# Patient Record
Sex: Female | Born: 1945 | Race: Black or African American | Hispanic: No | Smoking: Never smoker
Health system: Southern US, Community
[De-identification: ages and names within clinical notes are randomized; demographics above are authoritative.]

## PROBLEM LIST (undated history)

## (undated) DIAGNOSIS — N809 Endometriosis, unspecified: Secondary | ICD-10-CM

## (undated) DIAGNOSIS — D649 Anemia, unspecified: Secondary | ICD-10-CM

## (undated) DIAGNOSIS — M1990 Unspecified osteoarthritis, unspecified site: Secondary | ICD-10-CM

## (undated) HISTORY — PX: MYOMECTOMY: SHX85

## (undated) HISTORY — PX: PARTIAL HYSTERECTOMY: SHX80

## (undated) HISTORY — PX: KNEE ARTHROSCOPY: SHX127

## (undated) HISTORY — PX: FOOT SURGERY: SHX648

---

## 2000-02-13 ENCOUNTER — Other Ambulatory Visit: Admission: RE | Admit: 2000-02-13 | Discharge: 2000-02-13 | Payer: Self-pay | Admitting: Obstetrics and Gynecology

## 2000-03-25 ENCOUNTER — Encounter: Payer: Self-pay | Admitting: Obstetrics and Gynecology

## 2000-03-25 ENCOUNTER — Ambulatory Visit (HOSPITAL_COMMUNITY): Admission: RE | Admit: 2000-03-25 | Discharge: 2000-03-25 | Payer: Self-pay | Admitting: Obstetrics and Gynecology

## 2001-12-30 ENCOUNTER — Other Ambulatory Visit: Admission: RE | Admit: 2001-12-30 | Discharge: 2001-12-30 | Payer: Self-pay | Admitting: Obstetrics and Gynecology

## 2006-05-07 ENCOUNTER — Ambulatory Visit (HOSPITAL_BASED_OUTPATIENT_CLINIC_OR_DEPARTMENT_OTHER): Admission: RE | Admit: 2006-05-07 | Discharge: 2006-05-07 | Payer: Self-pay | Admitting: Orthopedic Surgery

## 2008-09-14 ENCOUNTER — Other Ambulatory Visit: Admission: RE | Admit: 2008-09-14 | Discharge: 2008-09-14 | Payer: Self-pay | Admitting: Obstetrics and Gynecology

## 2008-09-14 ENCOUNTER — Ambulatory Visit: Payer: Self-pay | Admitting: Obstetrics and Gynecology

## 2008-09-14 ENCOUNTER — Encounter: Payer: Self-pay | Admitting: Obstetrics and Gynecology

## 2008-10-18 ENCOUNTER — Ambulatory Visit: Payer: Self-pay | Admitting: Obstetrics and Gynecology

## 2011-04-05 NOTE — Op Note (Signed)
NAMECAMELIA, Sara Roberson              ACCOUNT NO.:  [id number]   MEDICAL RECORD NO.:  [id number]          PATIENT TYPE:  AMB   LOCATION:  DSC                          FACILITY:  [facility]   PHYSICIAN:  Loreta Ave, M.D. DATE OF BIRTH:  1946-08-01   DATE OF PROCEDURE:  05/07/2006  DATE OF DISCHARGE:                                 OPERATIVE REPORT   PREOPERATIVE DIAGNOSIS:  Left knee tricompartmental chondromalacia, with  torn lateral meniscus.   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   PROCEDURE:  Left knee exam under anesthesia, arthroscopy, debridement of  medial and lateral meniscus, removal of loose body, generalized  chondroplasty of all three compartments, with partial synovectomy.   SURGEON:  Loreta Ave, M.D.   ASSISTANT:  Genene Churn. Denton Meek.   ANESTHESIA:  General.   BLOOD LOSS:  Minimal.   TOURNIQUET:  Not employed.   SPECIMENS:  None.   CULTURES:  None.   COMPLICATIONS:  None.   DRESSINGS:  Soft, compressive.   PROCEDURE:  The patient was brought to the operating room, and after  adequate anesthesia had been obtained, left knee examined.  Full motion  lateral and patellofemoral tracking, not extensive tethering.  Stable  ligaments.  Tourniquet and leg holder applied.  Leg prepped and draped in  the usual sterile fashion.  Three portals were created, one superolateral,  and one in each medial and lateral parapatellar.  Inflow catheter induced.  Knee distended, arthroscope introduced, and the knee inspected.  Extensive  grade 3 changes of patellofemoral joint, with the entire lateral half grade  4 both on the patella and on the trochlea through full motion.  She tracks  laterally, but does not tether.  Chondroplasty throughout.  One large 1.5 cm  diameter osteochondral loose  body found in the suprapatellar pouch, able to  be retrieved and extracted with a large grasping forceps.  Cruciate ligament  was intact.  Medial compartment grade 2 changes, with frayed flap tears  middle and anterior third debrided.  Lateral compartment extensive tearing  of the anterior half lateral meniscus.  Numerous displaced irreparable  fragments.  Anterior half removed.  Taper into remaining medial meniscus  __________ in the posterior half.  Grade 2 and some grade 3 changes there.  Chondroplasty throughout.  Marked hypertrophic synovitis throughout the  knee, all debrided.  All  recesses examined to be sure all loose bodies were removed.  Instruments and  fluid removed.  Portals of the knee injected with Marcaine.  Portals closed  with 4-0 nylon.  Sterile compressive dressing applied.  Anesthesia reversed.  Brought to the recovery room.  Tolerated surgery well.  No complications.      Loreta Ave, M.D.  Electronically Signed     DFM/MEDQ  D:  05/07/2006  T:  05/07/2006  Job:  [id number]

## 2015-08-29 ENCOUNTER — Encounter (HOSPITAL_COMMUNITY): Payer: Self-pay | Admitting: Emergency Medicine

## 2015-08-29 ENCOUNTER — Emergency Department (HOSPITAL_COMMUNITY): Payer: Medicare Other

## 2015-08-29 ENCOUNTER — Emergency Department (HOSPITAL_COMMUNITY)
Admission: EM | Admit: 2015-08-29 | Discharge: 2015-08-29 | Disposition: A | Discharge status: Home / Self Care | Payer: Medicare Other | Attending: Emergency Medicine | Admitting: Emergency Medicine

## 2015-08-29 DIAGNOSIS — Z79899 Other long term (current) drug therapy: Secondary | ICD-10-CM | POA: Diagnosis not present

## 2015-08-29 DIAGNOSIS — I10 Essential (primary) hypertension: Secondary | ICD-10-CM | POA: Insufficient documentation

## 2015-08-29 DIAGNOSIS — R42 Dizziness and giddiness: Secondary | ICD-10-CM | POA: Insufficient documentation

## 2015-08-29 DIAGNOSIS — R112 Nausea with vomiting, unspecified: Secondary | ICD-10-CM | POA: Insufficient documentation

## 2015-08-29 LAB — CBC
HCT: 37.2 % (ref 36.0–46.0)
HEMOGLOBIN: 11.7 g/dL — AB (ref 12.0–15.0)
MCH: 29 pg (ref 26.0–34.0)
MCHC: 31.5 g/dL (ref 30.0–36.0)
MCV: 92.1 fL (ref 78.0–100.0)
PLATELETS: 198 10*3/uL (ref 150–400)
RBC: 4.04 MIL/uL (ref 3.87–5.11)
RDW: 13.8 % (ref 11.5–15.5)
WBC: 4.1 10*3/uL (ref 4.0–10.5)

## 2015-08-29 LAB — BASIC METABOLIC PANEL
ANION GAP: 4 — AB (ref 5–15)
BUN: 12 mg/dL (ref 6–20)
CHLORIDE: 112 mmol/L — AB (ref 101–111)
CO2: 24 mmol/L (ref 22–32)
CREATININE: 0.64 mg/dL (ref 0.44–1.00)
Calcium: 9.1 mg/dL (ref 8.9–10.3)
GFR calc non Af Amer: >60 mL/min (ref >60)
Glucose, Bld: 113 mg/dL — ABNORMAL HIGH (ref 65–99)
POTASSIUM: 4.2 mmol/L (ref 3.5–5.1)
SODIUM: 140 mmol/L (ref 135–145)

## 2015-08-29 LAB — URINALYSIS, ROUTINE W REFLEX MICROSCOPIC
Bilirubin Urine: NEGATIVE
Glucose, UA: NEGATIVE mg/dL
Hgb urine dipstick: NEGATIVE
KETONES UR: NEGATIVE mg/dL
LEUKOCYTES UA: NEGATIVE
NITRITE: NEGATIVE
PH: 8 (ref 5.0–8.0)
PROTEIN: NEGATIVE mg/dL
Specific Gravity, Urine: 1.016 (ref 1.005–1.030)
Urobilinogen, UA: 0.2 mg/dL (ref 0.0–1.0)

## 2015-08-29 LAB — CBG MONITORING, ED: Glucose-Capillary: 107 mg/dL — ABNORMAL HIGH (ref 65–99)

## 2015-08-29 MED ORDER — LORAZEPAM 2 MG/ML IJ SOLN
1.0000 mg | Freq: Once | INTRAMUSCULAR | Status: AC
  Administered 2015-08-29: 1 mg via INTRAVENOUS
  Filled 2015-08-29: qty 1

## 2015-08-29 MED ORDER — MECLIZINE HCL 50 MG PO TABS: 25.0000 mg | ORAL_TABLET | Freq: Three times a day (TID) | ORAL | Status: DC | PRN

## 2015-08-29 MED ORDER — MECLIZINE HCL 25 MG PO TABS
25.0000 mg | ORAL_TABLET | Freq: Once | ORAL | Status: AC
  Administered 2015-08-29: 25 mg via ORAL
  Filled 2015-08-29: qty 1

## 2015-08-29 NOTE — ED Notes (Signed)
PT st's dizziness has gotton some better after medication.  Pt remains alert and oriented x's 3

## 2015-08-29 NOTE — ED Provider Notes (Signed)
Mr Brain Wo Contrast  08/29/2015   CLINICAL DATA:  Ataxia. Awoke this morning with dizziness and nausea.  EXAM: MRI HEAD WITHOUT CONTRAST  TECHNIQUE: Multiplanar, multiecho pulse sequences of the brain and surrounding structures were obtained without intravenous contrast.  COMPARISON:  None.  FINDINGS: There is a mildly expanded partially empty sella. The cerebellar tonsils project at or minimally below the foramen magnum, within normal limits. There is no evidence of acute infarct, intracranial hemorrhage, mass, midline shift, or extra-axial fluid collection. Ventricles and sulci are normal for age. No significant white matter disease is seen for age.  Prior bilateral cataract extraction is noted. Paranasal sinuses and mastoid air cells are clear. Major intracranial vascular flow voids are preserved.  IMPRESSION: 1. No acute intracranial abnormality. 2. Partially empty sella, a nonspecific finding although can be seen in the setting of intracranial hypertension.   Electronically Signed   By: Sebastian Ache M.D.   On: 08/29/2015 19:08   MRI without acute stroke.  Dc home with symptomatic meds for vertigo  Linwood Dibbles, MD 08/29/15 1944

## 2015-08-29 NOTE — Discharge Instructions (Signed)
Dizziness °Dizziness is a common problem. It is a feeling of unsteadiness or light-headedness. You may feel like you are about to faint. Dizziness can lead to injury if you stumble or fall. Anyone can become dizzy, but dizziness is more common in older adults. This condition can be caused by a number of things, including medicines, dehydration, or illness. °HOME CARE INSTRUCTIONS °Taking these steps may help with your condition: °Eating and Drinking °· Drink enough fluid to keep your urine clear or pale yellow. This helps to keep you from becoming dehydrated. Try to drink more clear fluids, such as water. °· Do not drink alcohol. °· Limit your caffeine intake if directed by your health care provider. °· Limit your salt intake if directed by your health care provider. °Activity °· Avoid making quick movements. °· Rise slowly from chairs and steady yourself until you feel okay. °· In the morning, first sit up on the side of the bed. When you feel okay, stand slowly while you hold onto something until you know that your balance is fine. °· Move your legs often if you need to stand in one place for a long time. Tighten and relax your muscles in your legs while you are standing. °· Do not drive or operate heavy machinery if you feel dizzy. °· Avoid bending down if you feel dizzy. Place items in your home so that they are easy for you to reach without leaning over. °Lifestyle °· Do not use any tobacco products, including cigarettes, chewing tobacco, or electronic cigarettes. If you need help quitting, ask your health care provider. °· Try to reduce your stress level, such as with yoga or meditation. Talk with your health care provider if you need help. °General Instructions °· Watch your dizziness for any changes. °· Take medicines only as directed by your health care provider. Talk with your health care provider if you think that your dizziness is caused by a medicine that you are taking. °· Tell a friend or a family  member that you are feeling dizzy. If he or she notices any changes in your behavior, have this person call your health care provider. °· Keep all follow-up visits as directed by your health care provider. This is important. °SEEK MEDICAL CARE IF: °· Your dizziness does not go away. °· Your dizziness or light-headedness gets worse. °· You feel nauseous. °· You have reduced hearing. °· You have new symptoms. °· You are unsteady on your feet or you feel like the room is spinning. °SEEK IMMEDIATE MEDICAL CARE IF: °· You vomit or have diarrhea and are unable to eat or drink anything. °· You have problems talking, walking, swallowing, or using your arms, hands, or legs. °· You feel generally weak. °· You are not thinking clearly or you have trouble forming sentences. It may take a friend or family member to notice this. °· You have chest pain, abdominal pain, shortness of breath, or sweating. °· Your vision changes. °· You notice any bleeding. °· You have a headache. °· You have neck pain or a stiff neck. °· You have a fever. °  °This information is not intended to replace advice given to you by your health care provider. Make sure you discuss any questions you have with your health care provider. °  °Document Released: 04/30/2001 Document Revised: 03/21/2015 Document Reviewed: 10/31/2014 °Elsevier Interactive Patient Education ©2016 [employer] ° °Benign Positional Vertigo °Vertigo is the feeling that you or your surroundings are moving when they are not.   Benign positional vertigo is the most common form of vertigo. The cause of this condition is not serious (is benign). This condition is triggered by certain movements and positions (is positional). This condition can be dangerous if it occurs while you are doing something that could endanger you or others, such as driving.  °CAUSES °In many cases, the cause of this condition is not known. It may be caused by a disturbance in an area of the inner ear that helps your  brain to sense movement and balance. This disturbance can be caused by a viral infection (labyrinthitis), head injury, or repetitive motion. °RISK FACTORS °This condition is more likely to develop in: °· Women. °· People who are 50 years of age or older. °SYMPTOMS °Symptoms of this condition usually happen when you move your head or your eyes in different directions. Symptoms may start suddenly, and they usually last for less than a minute. Symptoms may include: °· Loss of balance and falling. °· Feeling like you are spinning or moving. °· Feeling like your surroundings are spinning or moving. °· Nausea and vomiting. °· Blurred vision. °· Dizziness. °· Involuntary eye movement (nystagmus). °Symptoms can be mild and cause only slight annoyance, or they can be severe and interfere with daily life. Episodes of benign positional vertigo may return (recur) over time, and they may be triggered by certain movements. Symptoms may improve over time. °DIAGNOSIS °This condition is usually diagnosed by medical history and a physical exam of the head, neck, and ears. You may be referred to a health care provider who specializes in ear, nose, and throat (ENT) problems (otolaryngologist) or a provider who specializes in disorders of the nervous system (neurologist). You may have additional testing, including: °· MRI. °· A CT scan. °· Eye movement tests. Your health care provider may ask you to change positions quickly while he or she watches you for symptoms of benign positional vertigo, such as nystagmus. Eye movement may be tested with an electronystagmogram (ENG), caloric stimulation, the Dix-Hallpike test, or the roll test. °· An electroencephalogram (EEG). This records electrical activity in your brain. °· Hearing tests. °TREATMENT °Usually, your health care provider will treat this by moving your head in specific positions to adjust your inner ear back to normal. Surgery may be needed in severe cases, but this is rare. In  some cases, benign positional vertigo may resolve on its own in 2-4 weeks. °HOME CARE INSTRUCTIONS °Safety °· Move slowly. Avoid sudden body or head movements. °· Avoid driving. °· Avoid operating heavy machinery. °· Avoid doing any tasks that would be dangerous to you or others if a vertigo episode would occur. °· If you have trouble walking or keeping your balance, try using a cane for stability. If you feel dizzy or unstable, sit down right away. °· Return to your normal activities as told by your health care provider. Ask your health care provider what activities are safe for you. °General Instructions °· Take over-the-counter and prescription medicines only as told by your health care provider. °· Avoid certain positions or movements as told by your health care provider. °· Drink enough fluid to keep your urine clear or pale yellow. °· Keep all follow-up visits as told by your health care provider. This is important. °SEEK MEDICAL CARE IF: °· You have a fever. °· Your condition gets worse or you develop new symptoms. °· Your family or friends notice any behavioral changes. °· Your nausea or vomiting gets worse. °· You have numbness or a "  pins and needles" sensation. °SEEK IMMEDIATE MEDICAL CARE IF: °· You have difficulty speaking or moving. °· You are always dizzy. °· You faint. °· You develop severe headaches. °· You have weakness in your legs or arms. °· You have changes in your hearing or vision. °· You develop a stiff neck. °· You develop sensitivity to light. °  °This information is not intended to replace advice given to you by your health care provider. Make sure you discuss any questions you have with your health care provider. °  °Document Released: 08/12/2006 Document Revised: 07/26/2015 Document Reviewed: 02/27/2015 °Elsevier Interactive Patient Education ©2016 [employer]vier Inc[employer] ° °

## 2015-08-29 NOTE — ED Provider Notes (Signed)
CSN: [id number]     Arrival date & time 08/29/15  1340 History   First MD Initiated Contact with Patient 08/29/15 1344     Chief Complaint  Patient presents with  . Dizziness  . Emesis     (Consider location/radiation/quality/duration/timing/severity/associated sxs/prior Treatment) Patient is a 69 y.o. female presenting with dizziness.  Dizziness Quality:  Head spinning and room spinning Severity:  Moderate Onset quality:  Unable to specify Duration:  10 hours Timing:  Constant Progression:  Unchanged Chronicity:  Recurrent Context comment:  Awoke from sleep Relieved by:  Nothing Worsened by:  Turning head Associated symptoms: nausea and vomiting   Associated symptoms: no blood in stool, no chest pain, no diarrhea and no headaches     History reviewed. No pertinent past medical history. Past Surgical History  Procedure Laterality Date  . Knee arthroscopy    . Partial hysterectomy    . Myomectomy    . Foot surgery     No family history on file. Social History  Substance Use Topics  . Smoking status: Never Smoker   . Smokeless tobacco: None  . Alcohol Use: No   OB History    No data available     Review of Systems  Cardiovascular: Negative for chest pain.  Gastrointestinal: Positive for nausea and vomiting. Negative for diarrhea and blood in stool.  Neurological: Positive for dizziness. Negative for headaches.  All other systems reviewed and are negative.     Allergies  Aspirin  Home Medications   Prior to Admission medications   Medication Sig Start Date End Date Taking? Authorizing Provider  Ascorbic Acid (VITAMIN C PO) Take 1 tablet by mouth daily.   Yes Historical Provider, MD  CALCIUM PO Take 1 tablet by mouth daily.   Yes Historical Provider, MD  GLUCOSAMINE-CHONDROITIN PO Take 2 tablets by mouth daily.   Yes Historical Provider, MD  Multiple Vitamin (MULTIVITAMIN WITH MINERALS) TABS tablet Take 1 tablet by mouth daily.   Yes Historical Provider,  MD  Multiple Vitamins-Minerals (HAIR/SKIN/NAILS PO) Take 1 tablet by mouth daily.   Yes Historical Provider, MD  naproxen sodium (ALEVE) 220 MG tablet Take 220 mg by mouth daily as needed. For knee pain   Yes Historical Provider, MD  omega-3 acid ethyl esters (LOVAZA) 1 G capsule Take 1 g by mouth daily.   Yes Historical Provider, MD  meclizine (ANTIVERT) 50 MG tablet Take 0.5 tablets (25 mg total) by mouth 3 (three) times daily as needed for dizziness or nausea. 08/29/15   Linwood Dibbles, MD   BP 144/86 mmHg  Pulse 72  Temp(Src) 97.8 F (36.6 C) (Oral)  Resp 18  Ht  (1.803 m)  Wt 270 lb (122.471 kg)  BMI 37.67 kg/m2  SpO2 100% Physical Exam  Constitutional: She is oriented to person, place, and time. She appears well-developed and well-nourished.  HENT:  Head: Normocephalic and atraumatic.  Right Ear: External ear normal.  Left Ear: External ear normal.  Eyes: Conjunctivae and EOM are normal. Pupils are equal, round, and reactive to light.  Neck: Normal range of motion. Neck supple.  Cardiovascular: Normal rate, regular rhythm, normal heart sounds and intact distal pulses.   Pulmonary/Chest: Effort normal and breath sounds normal.  Abdominal: Soft. Bowel sounds are normal. There is no tenderness.  Musculoskeletal: Normal range of motion.  Neurological: She is alert and oriented to person, place, and time. She has normal strength and normal reflexes. No cranial nerve deficit or sensory deficit. Coordination normal. GCS  eye subscore is 4. GCS verbal subscore is 5. GCS motor subscore is 6.  HINTS exam without nystagmus, skew  Skin: Skin is warm and dry.  Vitals reviewed.   ED Course  Procedures (including critical care time) Labs Review Labs Reviewed  BASIC METABOLIC PANEL - Abnormal; Notable for the following:    Chloride 112 (*)    Glucose, Bld 113 (*)    Anion gap 4 (*)    All other components within normal limits  CBC - Abnormal; Notable for the following:    Hemoglobin  11.7 (*)    All other components within normal limits  URINALYSIS, ROUTINE W REFLEX MICROSCOPIC (NOT AT [facility]) - Abnormal; Notable for the following:    APPearance CLOUDY (*)    All other components within normal limits  CBG MONITORING, ED - Abnormal; Notable for the following:    Glucose-Capillary 107 (*)    All other components within normal limits    Imaging Review No results found. I have personally reviewed and evaluated these images and lab results as part of my medical decision-making.   EKG Interpretation   Date/Time:  Tuesday August 29 2015 13:49:05 EDT Ventricular Rate:  79 PR Interval:  175 QRS Duration: 89 QT Interval:  363 QTC Calculation: 416 R Axis:   18 Text Interpretation:  Sinus rhythm Probable left atrial enlargement  Abnormal R-wave progression, early transition No significant change since  last tracing Confirmed by Mirian Mo [phone]) on 08/29/2015 2:03:01 PM      MDM   Final diagnoses:  Vertigo  Essential hypertension    69 y.o. female without pertinent PMH presents with sensation of unsteadiness beginning sometime between 0400 and 1000.  No focal neuro deficits by history.  Exam with ataxia, unable to confirm peripheral etiology.  MRI ordered.  Pt care to Dr. Lynelle Doctor with planned dc home if negative  I have reviewed all laboratory and imaging studies if ordered as above  1. Vertigo   2. Essential hypertension         Mirian Mo, MD 09/02/15 1640

## 2015-08-29 NOTE — ED Notes (Signed)
Family at bedside. 

## 2015-08-29 NOTE — ED Notes (Signed)
Today went to sleep at 0400 woke up at 1000 with dizziness and nausea. EMS transported CBG 109 alert answering following commands appropriate. Stated slept in air condition and developed nasal congestion and dry cough. Airway intact bilateral equal chest rise and fall.

## 2015-08-29 NOTE — ED Notes (Signed)
Pt to MRI at this time.

## 2015-08-29 NOTE — ED Notes (Signed)
Received call from MRI stating that pt needs Ativan due to being claustrophobic.  Dr. Lynelle Doctor made aware

## 2016-03-09 DIAGNOSIS — S40012A Contusion of left shoulder, initial encounter: Secondary | ICD-10-CM | POA: Diagnosis not present

## 2016-03-09 DIAGNOSIS — S0990XA Unspecified injury of head, initial encounter: Secondary | ICD-10-CM | POA: Diagnosis not present

## 2016-03-09 DIAGNOSIS — S40022A Contusion of left upper arm, initial encounter: Secondary | ICD-10-CM | POA: Diagnosis not present

## 2016-03-09 DIAGNOSIS — S62617A Displaced fracture of proximal phalanx of left little finger, initial encounter for closed fracture: Secondary | ICD-10-CM | POA: Diagnosis not present

## 2016-03-12 ENCOUNTER — Other Ambulatory Visit: Payer: Self-pay | Admitting: Orthopedic Surgery

## 2016-03-12 DIAGNOSIS — M75102 Unspecified rotator cuff tear or rupture of left shoulder, not specified as traumatic: Secondary | ICD-10-CM

## 2016-03-12 DIAGNOSIS — M79645 Pain in left finger(s): Secondary | ICD-10-CM | POA: Diagnosis not present

## 2016-03-12 DIAGNOSIS — M25522 Pain in left elbow: Secondary | ICD-10-CM | POA: Diagnosis not present

## 2016-03-12 DIAGNOSIS — M25512 Pain in left shoulder: Secondary | ICD-10-CM | POA: Diagnosis not present

## 2016-03-18 ENCOUNTER — Other Ambulatory Visit: Payer: Medicare Other

## 2016-03-21 ENCOUNTER — Ambulatory Visit
Admission: RE | Admit: 2016-03-21 | Discharge: 2016-03-21 | Disposition: A | Discharge status: Home / Self Care | Payer: Medicare Other | Source: Ambulatory Visit | Attending: Orthopedic Surgery | Admitting: Orthopedic Surgery

## 2016-03-21 DIAGNOSIS — M75102 Unspecified rotator cuff tear or rupture of left shoulder, not specified as traumatic: Secondary | ICD-10-CM

## 2016-03-21 DIAGNOSIS — M75112 Incomplete rotator cuff tear or rupture of left shoulder, not specified as traumatic: Secondary | ICD-10-CM | POA: Diagnosis not present

## 2016-04-02 DIAGNOSIS — M25512 Pain in left shoulder: Secondary | ICD-10-CM | POA: Diagnosis not present

## 2016-06-25 DIAGNOSIS — H40013 Open angle with borderline findings, low risk, bilateral: Secondary | ICD-10-CM | POA: Diagnosis not present

## 2016-08-29 ENCOUNTER — Encounter (HOSPITAL_BASED_OUTPATIENT_CLINIC_OR_DEPARTMENT_OTHER): Admission: RE | Payer: Self-pay | Source: Ambulatory Visit

## 2016-08-29 ENCOUNTER — Ambulatory Visit (HOSPITAL_BASED_OUTPATIENT_CLINIC_OR_DEPARTMENT_OTHER): Admission: RE | Admit: 2016-08-29 | Payer: Medicare Other | Source: Ambulatory Visit | Admitting: Orthopedic Surgery

## 2016-08-29 SURGERY — SHOULDER ARTHROSCOPY WITH ROTATOR CUFF REPAIR AND SUBACROMIAL DECOMPRESSION
Anesthesia: General | Laterality: Left

## 2016-09-03 DIAGNOSIS — M25561 Pain in right knee: Secondary | ICD-10-CM | POA: Diagnosis not present

## 2016-09-03 DIAGNOSIS — M25562 Pain in left knee: Secondary | ICD-10-CM | POA: Diagnosis not present

## 2017-05-25 ENCOUNTER — Encounter (HOSPITAL_COMMUNITY): Payer: Self-pay | Admitting: Emergency Medicine

## 2017-05-25 ENCOUNTER — Ambulatory Visit (INDEPENDENT_AMBULATORY_CARE_PROVIDER_SITE_OTHER): Payer: Medicare Other

## 2017-05-25 ENCOUNTER — Ambulatory Visit (HOSPITAL_COMMUNITY)
Admission: EM | Admit: 2017-05-25 | Discharge: 2017-05-25 | Disposition: A | Discharge status: Home / Self Care | Payer: Medicare Other | Attending: Internal Medicine | Admitting: Internal Medicine

## 2017-05-25 DIAGNOSIS — R109 Unspecified abdominal pain: Secondary | ICD-10-CM

## 2017-05-25 DIAGNOSIS — R079 Chest pain, unspecified: Secondary | ICD-10-CM | POA: Diagnosis not present

## 2017-05-25 LAB — POCT URINALYSIS DIP (DEVICE)
BILIRUBIN URINE: NEGATIVE
GLUCOSE, UA: NEGATIVE mg/dL
Hgb urine dipstick: NEGATIVE
KETONES UR: NEGATIVE mg/dL
Leukocytes, UA: NEGATIVE
Nitrite: NEGATIVE
Protein, ur: NEGATIVE mg/dL
Specific Gravity, Urine: 1.015 (ref 1.005–1.030)
Urobilinogen, UA: 1 mg/dL (ref 0.0–1.0)
pH: 7 (ref 5.0–8.0)

## 2017-05-25 MED ORDER — NAPROXEN 375 MG PO TABS: 375.0000 mg | ORAL_TABLET | Freq: Two times a day (BID) | ORAL | 0 refills | Status: DC

## 2017-05-25 MED ORDER — TRAMADOL HCL 50 MG PO TABS: 25.0000 mg | ORAL_TABLET | Freq: Four times a day (QID) | ORAL | 0 refills | Status: DC | PRN

## 2017-05-25 NOTE — Discharge Instructions (Addendum)
There are many possible causes of right lower chest pain. These include musculoskeletal pain and shingles.  Based on exam today, less likely to be liver or gallbladder ailments, kidney stone, blood clot, developing pneumonia, heart disease.  No danger signs on exam today. EKG today was normal. Chest x-ray showed slight underinflation of the right lung base, of uncertain cause.  To help with pain, prescriptions for tramadol and Naprosyn, were given. You can take either or both of these.  Follow-up with your primary care provider in the next week or so, if symptoms are not improving. Go to the emergency room if you become suddenly breathless.

## 2017-05-25 NOTE — ED Triage Notes (Signed)
Onset Friday afternoon of pain.  Pain in right torso.  Patient does not report anything that makes it better or worse.  No vomiting .  Last bm was Saturday.  Patient reports having 2 watery stools.  Took baking soda for belching and thinks this resulted in watery stools

## 2017-05-25 NOTE — ED Provider Notes (Signed)
[facility]    CSN: [id number] Arrival date & time: 05/25/17  1528     History   Chief Complaint Chief Complaint  Patient presents with  . Abdominal Pain    HPI Sara Roberson is a 71 y.o. female. She presents today with 2 day history of pain in the right lower chest, under her breast, radiates around posteriorly, and up into the upper right chest. It is slightly worse with deep breath. She has a little bit of cough. No fever, no runny nose. No nausea or vomiting, but appetite has been decreased. It hurts to lay on her right side. A little bit of loose stool yesterday, after taking baking soda. She took baking soda because she had been belching. She does not have a history of reflux. She takes Naprosyn infrequently, less than weekly, for knee pain. No urinary frequency or discomfort. No unusual leg pain or swelling. Still has her appendix and her gallbladder. Has had a partial hysterectomy for recurrence of fibroids. Remote history of a kidney stone, 30 some years ago. Her son has had a lot of trouble with kidney stones. No family history of coronary disease, and minimal risk factors: She is not hypertensive, no diabetes, no cholesterol, nonsmoker.       HPI  History reviewed. No pertinent past medical history.   Past Surgical History:  Procedure Laterality Date  . FOOT SURGERY    . KNEE ARTHROSCOPY    . MYOMECTOMY    . PARTIAL HYSTERECTOMY      Home Medications    Prior to Admission medications   Medication Sig Start Date End Date Taking? Authorizing Provider  Ascorbic Acid (VITAMIN C PO) Take 1 tablet by mouth daily.    [provider]  CALCIUM PO Take 1 tablet by mouth daily.    [provider]  GLUCOSAMINE-CHONDROITIN PO Take 2 tablets by mouth daily.    [provider]  meclizine (ANTIVERT) 50 MG tablet Take 0.5 tablets (25 mg total) by mouth 3 (three) times daily as needed for dizziness or nausea. 08/29/15   Linwood Dibbles, MD    Multiple Vitamin (MULTIVITAMIN WITH MINERALS) TABS tablet Take 1 tablet by mouth daily.    [provider]  Multiple Vitamins-Minerals (HAIR/SKIN/NAILS PO) Take 1 tablet by mouth daily.    [provider]  naproxen (NAPROSYN) 375 MG tablet Take 1 tablet (375 mg total) by mouth 2 (two) times daily. 05/25/17   Eustace Moore, MD  naproxen sodium (ALEVE) 220 MG tablet Take 220 mg by mouth daily as needed. For knee pain    [provider]  omega-3 acid ethyl esters (LOVAZA) 1 G capsule Take 1 g by mouth daily.    [provider]  traMADol (ULTRAM) 50 MG tablet Take 0.5 tablets (25 mg total) by mouth 4 (four) times daily as needed. 05/25/17   Eustace Moore, MD    Family History No family history on file.  Social History Social History  Substance Use Topics  . Smoking status: Never Smoker  . Smokeless tobacco: Not on file  . Alcohol use No     Allergies   Aspirin   Review of Systems Review of Systems  All other systems reviewed and are negative.    Physical Exam Triage Vital Signs ED Triage Vitals  Enc Vitals Group     BP 05/25/17 1628 133/62     Pulse Rate 05/25/17 1628 85     Resp 05/25/17 1628 20  Temp 05/25/17 1628 98.4 F (36.9 C)     Temp Source 05/25/17 1628 Oral     SpO2 05/25/17 1628 96 %     Weight --      Height --      Pain Score 05/25/17 1626 10     Pain Loc --    Updated Vital Signs BP 133/62 (BP Location: Right Arm) Comment (BP Location): large cuff  Pulse 85   Temp 98.4 F (36.9 C) (Oral)   Resp 20   SpO2 96%   Physical Exam  Constitutional: She is oriented to person, place, and time. No distress.  HENT:  Head: Atraumatic.  Eyes:  Conjugate gaze observed, no eye redness/discharge  Neck: Neck supple.  Cardiovascular: Normal rate and regular rhythm.   Right lower chest wall is tender to palpation, particularly under the right breast. No rashes evident, no erythema.  Pulmonary/Chest: No respiratory distress.  She has no wheezes. She has no rales.  Lungs clear, symmetric breath sounds  Abdominal: Soft. She exhibits no distension. There is no tenderness. There is no rebound and no guarding.  Musculoskeletal: Normal range of motion.  1+ bilateral ankle edema  Neurological: She is alert and oriented to person, place, and time.  Skin: Skin is warm and dry.  Nursing note and vitals reviewed.    UC Treatments / Results  Labs Results for orders placed or performed during the hospital encounter of 05/25/17  POCT urinalysis dip (device)  Result Value Ref Range   Glucose, UA NEGATIVE NEGATIVE mg/dL   Bilirubin Urine NEGATIVE NEGATIVE   Ketones, ur NEGATIVE NEGATIVE mg/dL   Specific Gravity, Urine 1.015 1.005 - 1.030   Hgb urine dipstick NEGATIVE NEGATIVE   pH 7.0 5.0 - 8.0   Protein, ur NEGATIVE NEGATIVE mg/dL   Urobilinogen, UA 1.0 0.0 - 1.0 mg/dL   Nitrite NEGATIVE NEGATIVE   Leukocytes, UA NEGATIVE NEGATIVE    EKG Sinus rhythm, no acute ST or T wave changes, no ectopy, no sig change since ECG of 08/29/2015  Radiology EXAM: CHEST 2 VIEW  COMPARISON: None.  FINDINGS: There is right base atelectasis with small right pleural effusion. Lungs elsewhere are clear. Heart size and pulmonary vascularity are normal. No adenopathy. There is mild degenerative change in the thoracic spine.  IMPRESSION: Right base atelectasis with small right pleural effusion. Lungs elsewhere clear. Cardiac silhouette within normal limits.   Electronically Signed By: Bretta Bang III M.D. On: 05/25/2017 17:32  Procedures Procedures (including critical care time) None today  Final Clinical Impressions(s) / UC Diagnoses   Final diagnoses:  Right-sided chest pain   There are many possible causes of right lower chest pain. These include musculoskeletal pain and shingles.  Based on exam today, less likely to be liver or gallbladder ailments, kidney stone, blood clot, developing pneumonia, heart  disease.  No danger signs on exam today. EKG today was normal. Chest x-ray showed slight underinflation of the right lung base, of uncertain cause.  To help with pain, prescriptions for tramadol and Naprosyn, were given. You can take either or both of these.  Follow-up with your primary care provider in the next week or so, if symptoms are not improving. Go to the emergency room if you become suddenly breathless.  New Prescriptions Discharge Medication List as of 05/25/2017  6:48 PM    START taking these medications   Details  naproxen (NAPROSYN) 375 MG tablet Take 1 tablet (375 mg total) by mouth 2 (two) times daily., Starting Sun  05/25/2017, Normal    traMADol (ULTRAM) 50 MG tablet Take 0.5 tablets (25 mg total) by mouth 4 (four) times daily as needed., Starting Sun 05/25/2017, Normal         Eustace MooreMurray, [place]ford Staheli W, MD 05/29/17 1139

## 2017-07-25 DIAGNOSIS — M545 Low back pain: Secondary | ICD-10-CM | POA: Diagnosis not present

## 2017-07-25 DIAGNOSIS — M25561 Pain in right knee: Secondary | ICD-10-CM | POA: Diagnosis not present

## 2017-08-01 DIAGNOSIS — M1711 Unilateral primary osteoarthritis, right knee: Secondary | ICD-10-CM | POA: Diagnosis not present

## 2017-08-08 DIAGNOSIS — M1711 Unilateral primary osteoarthritis, right knee: Secondary | ICD-10-CM | POA: Diagnosis not present

## 2017-08-15 DIAGNOSIS — M1711 Unilateral primary osteoarthritis, right knee: Secondary | ICD-10-CM | POA: Diagnosis not present

## 2017-09-01 ENCOUNTER — Encounter (HOSPITAL_COMMUNITY): Payer: Self-pay | Admitting: Emergency Medicine

## 2017-09-01 ENCOUNTER — Ambulatory Visit (HOSPITAL_COMMUNITY)
Admission: EM | Admit: 2017-09-01 | Discharge: 2017-09-01 | Disposition: A | Discharge status: Home / Self Care | Payer: Medicare Other | Attending: Emergency Medicine | Admitting: Emergency Medicine

## 2017-09-01 DIAGNOSIS — J069 Acute upper respiratory infection, unspecified: Secondary | ICD-10-CM | POA: Diagnosis not present

## 2017-09-01 DIAGNOSIS — B9789 Other viral agents as the cause of diseases classified elsewhere: Secondary | ICD-10-CM

## 2017-09-01 HISTORY — DX: Unspecified osteoarthritis, unspecified site: M19.90

## 2017-09-01 MED ORDER — BENZONATATE 100 MG PO CAPS: 100.0000 mg | ORAL_CAPSULE | Freq: Three times a day (TID) | ORAL | 0 refills | Status: DC

## 2017-09-01 MED ORDER — FLUTICASONE PROPIONATE 50 MCG/ACT NA SUSP
2.0000 | Freq: Every day | NASAL | 0 refills | Status: DC
Start: 2017-09-01 — End: 2022-04-10

## 2017-09-01 MED ORDER — CETIRIZINE-PSEUDOEPHEDRINE ER 5-120 MG PO TB12: 1.0000 | ORAL_TABLET | Freq: Every day | ORAL | 0 refills | Status: DC

## 2017-09-01 MED ORDER — FLUTICASONE PROPIONATE 50 MCG/ACT NA SUSP: 2.0000 | Freq: Every day | NASAL | 0 refills | Status: DC

## 2017-09-01 NOTE — ED Triage Notes (Signed)
Pt here with URI sx and cough 

## 2017-09-01 NOTE — Discharge Instructions (Signed)
Tessalon for cough. Start flonase, zyrtec-D for nasal congestion. You can use over the counter nasal saline rinse such as neti pot for nasal congestion. Keep hydrated, your urine should be clear to pale yellow in color. Tylenol/motrin for fever and pain. Monitor for any worsening of symptoms, chest pain, shortness of breath, wheezing, swelling of the throat, follow up for reevaluation.  ° °For sore throat try using a honey-based tea. Use 3 teaspoons of honey with juice squeezed from half lemon. Place shaved pieces of ginger into 1/2-1 cup of water and warm over stove top. Then mix the ingredients and repeat every 4 hours as needed. ° °

## 2017-09-01 NOTE — ED Provider Notes (Signed)
[facility]    CSN: [id number] Arrival date & time: 09/01/17  1928     History   Chief Complaint Chief Complaint  Patient presents with  . URI    HPI Sara Roberson is a 71 y.o. female.   71 year old female comes in for 5 day history of cough, nasal congestion, rhinorrhea. Denies fever, chills, night sweats. Has had some ear pressure/popping. Denies sore throat, chest pain, trouble breathing, shortness of breath, swelling of the throat. Tried otc cough medication with good relief. Positive sick contact. Never smoker.       Past Medical History:  Diagnosis Date  . Arthritis     There are no active problems to display for this patient.   Past Surgical History:  Procedure Laterality Date  . FOOT SURGERY    . KNEE ARTHROSCOPY    . MYOMECTOMY    . PARTIAL HYSTERECTOMY      OB History    No data available       Home Medications    Prior to Admission medications   Medication Sig Start Date End Date Taking? Authorizing Provider  Ascorbic Acid (VITAMIN C PO) Take 1 tablet by mouth daily.    [provider]  benzonatate (TESSALON) 100 MG capsule Take 1 capsule (100 mg total) by mouth every 8 (eight) hours. 09/01/17   Cathie Hoops, Amy V, PA-C  CALCIUM PO Take 1 tablet by mouth daily.    [provider]  cetirizine-pseudoephedrine (ZYRTEC-D) 5-120 MG tablet Take 1 tablet by mouth daily. 09/01/17   Cathie Hoops, Amy V, PA-C  fluticasone (FLONASE) 50 MCG/ACT nasal spray Place 2 sprays into both nostrils daily. 09/01/17   Yu, Amy V, PA-C  GLUCOSAMINE-CHONDROITIN PO Take 2 tablets by mouth daily.    [provider]  meclizine (ANTIVERT) 50 MG tablet Take 0.5 tablets (25 mg total) by mouth 3 (three) times daily as needed for dizziness or nausea. 08/29/15   Linwood Dibbles, MD  Multiple Vitamin (MULTIVITAMIN WITH MINERALS) TABS tablet Take 1 tablet by mouth daily.    [provider]  Multiple Vitamins-Minerals (HAIR/SKIN/NAILS PO) Take 1 tablet by  mouth daily.    [provider]  naproxen (NAPROSYN) 375 MG tablet Take 1 tablet (375 mg total) by mouth 2 (two) times daily. 05/25/17   Eustace Moore, MD  naproxen sodium (ALEVE) 220 MG tablet Take 220 mg by mouth daily as needed. For knee pain    [provider]  omega-3 acid ethyl esters (LOVAZA) 1 G capsule Take 1 g by mouth daily.    [provider]  traMADol (ULTRAM) 50 MG tablet Take 0.5 tablets (25 mg total) by mouth 4 (four) times daily as needed. 05/25/17   Eustace Moore, MD    Family History History reviewed. No pertinent family history.  Social History Social History  Substance Use Topics  . Smoking status: Never Smoker  . Smokeless tobacco: Never Used  . Alcohol use No     Allergies   Aspirin   Review of Systems Review of Systems  Reason unable to perform ROS: See HPI as above.     Physical Exam Triage Vital Signs ED Triage Vitals [09/01/17 1954]  Enc Vitals Group     BP (!) 172/65     Pulse Rate 84     Resp 18     Temp 98.8 F (37.1 C)     Temp Source Oral     SpO2 100 %  Weight      Height      Head Circumference      Peak Flow      Pain Score      Pain Loc      Pain Edu?      Excl. in GC?    No data found.   Updated Vital Signs BP (!) 172/65 (BP Location: Left Arm)   Pulse 84   Temp 98.8 F (37.1 C) (Oral)   Resp 18   SpO2 100%   Physical Exam  Constitutional: She is oriented to person, place, and time. She appears well-developed and well-nourished. No distress.  HENT:  Head: Normocephalic and atraumatic.  Right Ear: External ear and ear canal normal. Tympanic membrane is erythematous. Tympanic membrane is not bulging.  Left Ear: External ear and ear canal normal. Tympanic membrane is erythematous. Tympanic membrane is not bulging.  Nose: Mucosal edema and rhinorrhea present. Right sinus exhibits no maxillary sinus tenderness and no frontal sinus tenderness. Left sinus exhibits no maxillary sinus  tenderness and no frontal sinus tenderness.  Mouth/Throat: Uvula is midline, oropharynx is clear and moist and mucous membranes are normal.  Eyes: Pupils are equal, round, and reactive to light. Conjunctivae are normal.  Neck: Normal range of motion. Neck supple.  Cardiovascular: Normal rate, regular rhythm and normal heart sounds.  Exam reveals no gallop and no friction rub.   No murmur heard. Pulmonary/Chest: Effort normal and breath sounds normal. She has no decreased breath sounds. She has no wheezes. She has no rhonchi. She has no rales.  Lymphadenopathy:    She has no cervical adenopathy.  Neurological: She is alert and oriented to person, place, and time.  Skin: Skin is warm and dry.  Psychiatric: She has a normal mood and affect. Her behavior is normal. Judgment normal.     UC Treatments / Results  Labs (all labs ordered are listed, but only abnormal results are displayed) Labs Reviewed - No data to display  EKG  EKG Interpretation None       Radiology No results found.  Procedures Procedures (including critical care time)  Medications Ordered in UC Medications - No data to display   Initial Impression / Assessment and Plan / UC Course  I have reviewed the triage vital signs and the nursing notes.  Pertinent labs & imaging results that were available during my care of the patient were reviewed by me and considered in my medical decision making (see chart for details).    Discussed with patient history and exam most consistent with viral URI. Symptomatic treatment as needed. Push fluids. Return precautions given.   Final Clinical Impressions(s) / UC Diagnoses   Final diagnoses:  Viral URI with cough    New Prescriptions New Prescriptions   BENZONATATE (TESSALON) 100 MG CAPSULE    Take 1 capsule (100 mg total) by mouth every 8 (eight) hours.   CETIRIZINE-PSEUDOEPHEDRINE (ZYRTEC-D) 5-120 MG TABLET    Take 1 tablet by mouth daily.   FLUTICASONE (FLONASE) 50  MCG/ACT NASAL SPRAY    Place 2 sprays into both nostrils daily.      Belinda Fisher, PA-C 09/01/17 2124

## 2017-10-17 DIAGNOSIS — M170 Bilateral primary osteoarthritis of knee: Secondary | ICD-10-CM | POA: Diagnosis not present

## 2017-10-24 DIAGNOSIS — M1711 Unilateral primary osteoarthritis, right knee: Secondary | ICD-10-CM | POA: Diagnosis not present

## 2017-11-27 DIAGNOSIS — M25561 Pain in right knee: Secondary | ICD-10-CM | POA: Diagnosis not present

## 2017-11-27 DIAGNOSIS — M25562 Pain in left knee: Secondary | ICD-10-CM | POA: Diagnosis not present

## 2017-11-27 DIAGNOSIS — M170 Bilateral primary osteoarthritis of knee: Secondary | ICD-10-CM | POA: Diagnosis not present

## 2017-11-27 DIAGNOSIS — M21161 Varus deformity, not elsewhere classified, right knee: Secondary | ICD-10-CM | POA: Diagnosis not present

## 2018-03-03 DIAGNOSIS — M25461 Effusion, right knee: Secondary | ICD-10-CM | POA: Diagnosis not present

## 2018-03-03 DIAGNOSIS — M1711 Unilateral primary osteoarthritis, right knee: Secondary | ICD-10-CM | POA: Diagnosis not present

## 2018-03-03 DIAGNOSIS — M170 Bilateral primary osteoarthritis of knee: Secondary | ICD-10-CM | POA: Diagnosis not present

## 2018-03-03 DIAGNOSIS — M25561 Pain in right knee: Secondary | ICD-10-CM | POA: Diagnosis not present

## 2018-03-03 DIAGNOSIS — M25562 Pain in left knee: Secondary | ICD-10-CM | POA: Diagnosis not present

## 2018-03-17 DIAGNOSIS — M25561 Pain in right knee: Secondary | ICD-10-CM | POA: Diagnosis not present

## 2018-03-17 DIAGNOSIS — M1711 Unilateral primary osteoarthritis, right knee: Secondary | ICD-10-CM | POA: Diagnosis not present

## 2018-03-25 DIAGNOSIS — M25561 Pain in right knee: Secondary | ICD-10-CM | POA: Diagnosis not present

## 2018-03-25 DIAGNOSIS — M1711 Unilateral primary osteoarthritis, right knee: Secondary | ICD-10-CM | POA: Diagnosis not present

## 2018-03-31 DIAGNOSIS — M1711 Unilateral primary osteoarthritis, right knee: Secondary | ICD-10-CM | POA: Diagnosis not present

## 2018-03-31 DIAGNOSIS — M25561 Pain in right knee: Secondary | ICD-10-CM | POA: Diagnosis not present

## 2018-04-08 DIAGNOSIS — M25561 Pain in right knee: Secondary | ICD-10-CM | POA: Diagnosis not present

## 2018-04-08 DIAGNOSIS — M1711 Unilateral primary osteoarthritis, right knee: Secondary | ICD-10-CM | POA: Diagnosis not present

## 2018-07-15 DIAGNOSIS — M25561 Pain in right knee: Secondary | ICD-10-CM | POA: Diagnosis not present

## 2018-07-15 DIAGNOSIS — M170 Bilateral primary osteoarthritis of knee: Secondary | ICD-10-CM | POA: Diagnosis not present

## 2018-07-15 DIAGNOSIS — M1711 Unilateral primary osteoarthritis, right knee: Secondary | ICD-10-CM | POA: Diagnosis not present

## 2018-07-15 DIAGNOSIS — M25562 Pain in left knee: Secondary | ICD-10-CM | POA: Diagnosis not present

## 2018-07-15 DIAGNOSIS — M25461 Effusion, right knee: Secondary | ICD-10-CM | POA: Diagnosis not present

## 2018-07-16 DIAGNOSIS — H40013 Open angle with borderline findings, low risk, bilateral: Secondary | ICD-10-CM | POA: Diagnosis not present

## 2018-07-17 DIAGNOSIS — M1711 Unilateral primary osteoarthritis, right knee: Secondary | ICD-10-CM | POA: Diagnosis not present

## 2018-07-22 DIAGNOSIS — M1711 Unilateral primary osteoarthritis, right knee: Secondary | ICD-10-CM | POA: Diagnosis not present

## 2018-07-22 DIAGNOSIS — M25561 Pain in right knee: Secondary | ICD-10-CM | POA: Diagnosis not present

## 2018-07-28 DIAGNOSIS — M25561 Pain in right knee: Secondary | ICD-10-CM | POA: Diagnosis not present

## 2018-07-28 DIAGNOSIS — M1711 Unilateral primary osteoarthritis, right knee: Secondary | ICD-10-CM | POA: Diagnosis not present

## 2018-08-04 DIAGNOSIS — M1711 Unilateral primary osteoarthritis, right knee: Secondary | ICD-10-CM | POA: Diagnosis not present

## 2018-08-04 DIAGNOSIS — M25561 Pain in right knee: Secondary | ICD-10-CM | POA: Diagnosis not present

## 2018-08-28 DIAGNOSIS — H40013 Open angle with borderline findings, low risk, bilateral: Secondary | ICD-10-CM | POA: Diagnosis not present

## 2019-01-13 DIAGNOSIS — M170 Bilateral primary osteoarthritis of knee: Secondary | ICD-10-CM | POA: Diagnosis not present

## 2019-01-13 DIAGNOSIS — M25562 Pain in left knee: Secondary | ICD-10-CM | POA: Diagnosis not present

## 2019-01-13 DIAGNOSIS — M25561 Pain in right knee: Secondary | ICD-10-CM | POA: Diagnosis not present

## 2019-07-15 DIAGNOSIS — M1711 Unilateral primary osteoarthritis, right knee: Secondary | ICD-10-CM | POA: Diagnosis not present

## 2019-07-15 DIAGNOSIS — M170 Bilateral primary osteoarthritis of knee: Secondary | ICD-10-CM | POA: Diagnosis not present

## 2019-07-15 DIAGNOSIS — M25562 Pain in left knee: Secondary | ICD-10-CM | POA: Diagnosis not present

## 2019-07-15 DIAGNOSIS — M25461 Effusion, right knee: Secondary | ICD-10-CM | POA: Diagnosis not present

## 2019-07-15 DIAGNOSIS — M25561 Pain in right knee: Secondary | ICD-10-CM | POA: Diagnosis not present

## 2019-07-22 DIAGNOSIS — M170 Bilateral primary osteoarthritis of knee: Secondary | ICD-10-CM | POA: Diagnosis not present

## 2019-07-22 DIAGNOSIS — M25562 Pain in left knee: Secondary | ICD-10-CM | POA: Diagnosis not present

## 2019-08-05 DIAGNOSIS — M25561 Pain in right knee: Secondary | ICD-10-CM | POA: Diagnosis not present

## 2019-08-05 DIAGNOSIS — M1711 Unilateral primary osteoarthritis, right knee: Secondary | ICD-10-CM | POA: Diagnosis not present

## 2019-08-12 DIAGNOSIS — M25562 Pain in left knee: Secondary | ICD-10-CM | POA: Diagnosis not present

## 2019-08-12 DIAGNOSIS — M170 Bilateral primary osteoarthritis of knee: Secondary | ICD-10-CM | POA: Diagnosis not present

## 2019-08-19 DIAGNOSIS — M1711 Unilateral primary osteoarthritis, right knee: Secondary | ICD-10-CM | POA: Diagnosis not present

## 2019-08-19 DIAGNOSIS — M25561 Pain in right knee: Secondary | ICD-10-CM | POA: Diagnosis not present

## 2019-09-02 DIAGNOSIS — M25562 Pain in left knee: Secondary | ICD-10-CM | POA: Diagnosis not present

## 2019-09-02 DIAGNOSIS — M1712 Unilateral primary osteoarthritis, left knee: Secondary | ICD-10-CM | POA: Diagnosis not present

## 2019-09-09 DIAGNOSIS — M25561 Pain in right knee: Secondary | ICD-10-CM | POA: Diagnosis not present

## 2019-09-09 DIAGNOSIS — M1711 Unilateral primary osteoarthritis, right knee: Secondary | ICD-10-CM | POA: Diagnosis not present

## 2019-09-23 DIAGNOSIS — M25562 Pain in left knee: Secondary | ICD-10-CM | POA: Diagnosis not present

## 2019-09-23 DIAGNOSIS — M1712 Unilateral primary osteoarthritis, left knee: Secondary | ICD-10-CM | POA: Diagnosis not present

## 2019-09-28 DIAGNOSIS — Z23 Encounter for immunization: Secondary | ICD-10-CM | POA: Diagnosis not present

## 2019-10-07 DIAGNOSIS — M25561 Pain in right knee: Secondary | ICD-10-CM | POA: Diagnosis not present

## 2019-10-07 DIAGNOSIS — M1711 Unilateral primary osteoarthritis, right knee: Secondary | ICD-10-CM | POA: Diagnosis not present

## 2019-10-21 DIAGNOSIS — M1711 Unilateral primary osteoarthritis, right knee: Secondary | ICD-10-CM | POA: Diagnosis not present

## 2019-10-21 DIAGNOSIS — M25561 Pain in right knee: Secondary | ICD-10-CM | POA: Diagnosis not present

## 2020-01-20 DIAGNOSIS — M25461 Effusion, right knee: Secondary | ICD-10-CM | POA: Diagnosis not present

## 2020-01-20 DIAGNOSIS — M25561 Pain in right knee: Secondary | ICD-10-CM | POA: Diagnosis not present

## 2020-01-20 DIAGNOSIS — M25562 Pain in left knee: Secondary | ICD-10-CM | POA: Diagnosis not present

## 2020-01-20 DIAGNOSIS — M170 Bilateral primary osteoarthritis of knee: Secondary | ICD-10-CM | POA: Diagnosis not present

## 2020-01-27 DIAGNOSIS — M1711 Unilateral primary osteoarthritis, right knee: Secondary | ICD-10-CM | POA: Diagnosis not present

## 2020-01-27 DIAGNOSIS — M25461 Effusion, right knee: Secondary | ICD-10-CM | POA: Diagnosis not present

## 2020-01-27 DIAGNOSIS — M25561 Pain in right knee: Secondary | ICD-10-CM | POA: Diagnosis not present

## 2020-03-02 DIAGNOSIS — M1712 Unilateral primary osteoarthritis, left knee: Secondary | ICD-10-CM | POA: Diagnosis not present

## 2020-03-02 DIAGNOSIS — M25562 Pain in left knee: Secondary | ICD-10-CM | POA: Diagnosis not present

## 2020-03-10 DIAGNOSIS — M1711 Unilateral primary osteoarthritis, right knee: Secondary | ICD-10-CM | POA: Diagnosis not present

## 2020-03-10 DIAGNOSIS — M25561 Pain in right knee: Secondary | ICD-10-CM | POA: Diagnosis not present

## 2020-03-31 DIAGNOSIS — M170 Bilateral primary osteoarthritis of knee: Secondary | ICD-10-CM | POA: Diagnosis not present

## 2020-07-21 DIAGNOSIS — M170 Bilateral primary osteoarthritis of knee: Secondary | ICD-10-CM | POA: Diagnosis not present

## 2020-08-15 DIAGNOSIS — Z23 Encounter for immunization: Secondary | ICD-10-CM | POA: Diagnosis not present

## 2020-09-19 DIAGNOSIS — Z23 Encounter for immunization: Secondary | ICD-10-CM | POA: Diagnosis not present

## 2022-03-18 DIAGNOSIS — Z87442 Personal history of urinary calculi: Secondary | ICD-10-CM

## 2022-03-18 DIAGNOSIS — S83419A Sprain of medial collateral ligament of unspecified knee, initial encounter: Secondary | ICD-10-CM

## 2022-03-18 DIAGNOSIS — N1330 Unspecified hydronephrosis: Secondary | ICD-10-CM

## 2022-03-18 HISTORY — DX: Personal history of urinary calculi: Z87.442

## 2022-03-18 HISTORY — DX: Unspecified hydronephrosis: N13.30

## 2022-03-18 HISTORY — DX: Sprain of medial collateral ligament of unspecified knee, initial encounter: S83.419A

## 2022-04-03 ENCOUNTER — Encounter (HOSPITAL_COMMUNITY): Payer: Self-pay

## 2022-04-03 ENCOUNTER — Emergency Department (HOSPITAL_COMMUNITY): Payer: Medicare Other

## 2022-04-03 ENCOUNTER — Inpatient Hospital Stay (HOSPITAL_COMMUNITY)
Admission: EM | Admit: 2022-04-03 | Discharge: 2022-04-10 | DRG: 563 | Disposition: A | Discharge status: Skilled Nursing Facility | Payer: Medicare Other | Attending: Internal Medicine | Admitting: Internal Medicine

## 2022-04-03 DIAGNOSIS — D649 Anemia, unspecified: Secondary | ICD-10-CM | POA: Diagnosis present

## 2022-04-03 DIAGNOSIS — Z886 Allergy status to analgesic agent status: Secondary | ICD-10-CM

## 2022-04-03 DIAGNOSIS — E669 Obesity, unspecified: Secondary | ICD-10-CM | POA: Diagnosis present

## 2022-04-03 DIAGNOSIS — M25562 Pain in left knee: Secondary | ICD-10-CM | POA: Diagnosis present

## 2022-04-03 DIAGNOSIS — N132 Hydronephrosis with renal and ureteral calculous obstruction: Secondary | ICD-10-CM | POA: Diagnosis present

## 2022-04-03 DIAGNOSIS — M436 Torticollis: Secondary | ICD-10-CM | POA: Diagnosis present

## 2022-04-03 DIAGNOSIS — N1330 Unspecified hydronephrosis: Secondary | ICD-10-CM | POA: Diagnosis present

## 2022-04-03 DIAGNOSIS — Z6837 Body mass index (BMI) 37.0-37.9, adult: Secondary | ICD-10-CM

## 2022-04-03 DIAGNOSIS — E8809 Other disorders of plasma-protein metabolism, not elsewhere classified: Secondary | ICD-10-CM | POA: Diagnosis present

## 2022-04-03 DIAGNOSIS — S86312A Strain of muscle(s) and tendon(s) of peroneal muscle group at lower leg level, left leg, initial encounter: Secondary | ICD-10-CM | POA: Diagnosis present

## 2022-04-03 DIAGNOSIS — S80212A Abrasion, left knee, initial encounter: Secondary | ICD-10-CM | POA: Diagnosis present

## 2022-04-03 DIAGNOSIS — Z23 Encounter for immunization: Secondary | ICD-10-CM

## 2022-04-03 DIAGNOSIS — M25511 Pain in right shoulder: Secondary | ICD-10-CM | POA: Diagnosis present

## 2022-04-03 DIAGNOSIS — M25462 Effusion, left knee: Secondary | ICD-10-CM | POA: Diagnosis present

## 2022-04-03 DIAGNOSIS — S8992XA Unspecified injury of left lower leg, initial encounter: Secondary | ICD-10-CM

## 2022-04-03 DIAGNOSIS — M1712 Unilateral primary osteoarthritis, left knee: Secondary | ICD-10-CM | POA: Diagnosis present

## 2022-04-03 DIAGNOSIS — M25572 Pain in left ankle and joints of left foot: Secondary | ICD-10-CM | POA: Diagnosis present

## 2022-04-03 DIAGNOSIS — S83412A Sprain of medial collateral ligament of left knee, initial encounter: Principal | ICD-10-CM | POA: Diagnosis present

## 2022-04-03 DIAGNOSIS — R739 Hyperglycemia, unspecified: Secondary | ICD-10-CM | POA: Diagnosis present

## 2022-04-03 DIAGNOSIS — M1990 Unspecified osteoarthritis, unspecified site: Secondary | ICD-10-CM

## 2022-04-03 DIAGNOSIS — Y92481 Parking lot as the place of occurrence of the external cause: Secondary | ICD-10-CM

## 2022-04-03 DIAGNOSIS — R519 Headache, unspecified: Secondary | ICD-10-CM | POA: Diagnosis present

## 2022-04-03 DIAGNOSIS — S50311A Abrasion of right elbow, initial encounter: Secondary | ICD-10-CM | POA: Diagnosis present

## 2022-04-03 DIAGNOSIS — N201 Calculus of ureter: Secondary | ICD-10-CM | POA: Diagnosis present

## 2022-04-03 DIAGNOSIS — Z79899 Other long term (current) drug therapy: Secondary | ICD-10-CM

## 2022-04-03 LAB — CBC WITH DIFFERENTIAL/PLATELET
Abs Immature Granulocytes: 0.04 10*3/uL (ref 0.00–0.07)
Basophils Absolute: 0 10*3/uL (ref 0.0–0.1)
Basophils Relative: 0 %
Eosinophils Absolute: 0.1 10*3/uL (ref 0.0–0.5)
Eosinophils Relative: 1 %
HCT: 40.1 % (ref 36.0–46.0)
Hemoglobin: 12.6 g/dL (ref 12.0–15.0)
Immature Granulocytes: 1 %
Lymphocytes Relative: 13 %
Lymphs Abs: 1 10*3/uL (ref 0.7–4.0)
MCH: 30.4 pg (ref 26.0–34.0)
MCHC: 31.4 g/dL (ref 30.0–36.0)
MCV: 96.9 fL (ref 80.0–100.0)
Monocytes Absolute: 0.7 10*3/uL (ref 0.1–1.0)
Monocytes Relative: 10 %
Neutro Abs: 5.8 10*3/uL (ref 1.7–7.7)
Neutrophils Relative %: 75 %
Platelets: 282 10*3/uL (ref 150–400)
RBC: 4.14 MIL/uL (ref 3.87–5.11)
RDW: 12.9 % (ref 11.5–15.5)
WBC: 7.7 10*3/uL (ref 4.0–10.5)
nRBC: 0 % (ref 0.0–0.2)

## 2022-04-03 LAB — I-STAT CHEM 8, ED
BUN: 17 mg/dL (ref 8–23)
Calcium, Ion: 1.19 mmol/L (ref 1.15–1.40)
Chloride: 110 mmol/L (ref 98–111)
Creatinine, Ser: 0.9 mg/dL (ref 0.44–1.00)
Glucose, Bld: 153 mg/dL — ABNORMAL HIGH (ref 70–99)
HCT: 39 % (ref 36.0–46.0)
Hemoglobin: 13.3 g/dL (ref 12.0–15.0)
Potassium: 4.9 mmol/L (ref 3.5–5.1)
Sodium: 142 mmol/L (ref 135–145)
TCO2: 27 mmol/L (ref 22–32)

## 2022-04-03 LAB — BASIC METABOLIC PANEL
Anion gap: 3 — ABNORMAL LOW (ref 5–15)
BUN: 12 mg/dL (ref 8–23)
CO2: 25 mmol/L (ref 22–32)
Calcium: 9.6 mg/dL (ref 8.9–10.3)
Chloride: 112 mmol/L — ABNORMAL HIGH (ref 98–111)
Creatinine, Ser: 0.92 mg/dL (ref 0.44–1.00)
GFR, Estimated: >60 mL/min (ref >60)
Glucose, Bld: 153 mg/dL — ABNORMAL HIGH (ref 70–99)
Potassium: 4 mmol/L (ref 3.5–5.1)
Sodium: 140 mmol/L (ref 135–145)

## 2022-04-03 MED ORDER — SODIUM CHLORIDE 0.9 % IV SOLN: INTRAVENOUS | Status: DC

## 2022-04-03 MED ORDER — TETANUS-DIPHTH-ACELL PERTUSSIS 5-2.5-18.5 LF-MCG/0.5 IM SUSY
0.5000 mL | PREFILLED_SYRINGE | Freq: Once | INTRAMUSCULAR | Status: AC
Start: 2022-04-03 — End: 2022-04-03
  Administered 2022-04-03: 0.5 mL via INTRAMUSCULAR
  Filled 2022-04-03: qty 0.5

## 2022-04-03 MED ORDER — ONDANSETRON HCL 4 MG/2ML IJ SOLN
4.0000 mg | Freq: Once | INTRAMUSCULAR | Status: AC
Start: 2022-04-03 — End: 2022-04-03
  Administered 2022-04-03: 4 mg via INTRAVENOUS
  Filled 2022-04-03: qty 2

## 2022-04-03 MED ORDER — IOHEXOL 300 MG/ML  SOLN
100.0000 mL | Freq: Once | INTRAMUSCULAR | Status: AC | PRN
  Administered 2022-04-03: 100 mL via INTRAVENOUS

## 2022-04-03 MED ORDER — HYDROMORPHONE HCL 1 MG/ML IJ SOLN
0.5000 mg | Freq: Once | INTRAMUSCULAR | Status: AC
  Administered 2022-04-03: 0.5 mg via INTRAVENOUS
  Filled 2022-04-03: qty 1

## 2022-04-03 MED ORDER — SODIUM CHLORIDE 0.9 % IV BOLUS
500.0000 mL | Freq: Once | INTRAVENOUS | Status: AC
  Administered 2022-04-03: 500 mL via INTRAVENOUS

## 2022-04-03 NOTE — Discharge Instructions (Signed)
Call urology for the incidental finding of the kidney stone.  For further work-up. ? ? ?

## 2022-04-03 NOTE — ED Notes (Signed)
Phelbotomy and Radiologist present at bedside  ?
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
## 2022-04-03 NOTE — ED Notes (Signed)
Ortho Tech present at bedside ?
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
## 2022-04-03 NOTE — Progress Notes (Signed)
Orthopedic Tech Progress Note ?Patient Details:  ?Sara Roberson ?09-28-1946 ?[id number] ?Level 2 Trauma  ?Patient ID: [name], female   DOB: 03-28-1946, 76 y.o.   MRN: [id number] ? ?Dai Mcadams E Jahel Wavra ?04/03/2022, 8:12 PM ? ?
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
## 2022-04-04 ENCOUNTER — Emergency Department (HOSPITAL_COMMUNITY): Payer: Medicare Other

## 2022-04-04 ENCOUNTER — Other Ambulatory Visit: Payer: Self-pay

## 2022-04-04 DIAGNOSIS — Z23 Encounter for immunization: Secondary | ICD-10-CM | POA: Diagnosis present

## 2022-04-04 DIAGNOSIS — M25562 Pain in left knee: Secondary | ICD-10-CM

## 2022-04-04 DIAGNOSIS — Z79899 Other long term (current) drug therapy: Secondary | ICD-10-CM | POA: Diagnosis not present

## 2022-04-04 DIAGNOSIS — M25462 Effusion, left knee: Secondary | ICD-10-CM | POA: Diagnosis present

## 2022-04-04 DIAGNOSIS — Y92481 Parking lot as the place of occurrence of the external cause: Secondary | ICD-10-CM | POA: Diagnosis not present

## 2022-04-04 DIAGNOSIS — R739 Hyperglycemia, unspecified: Secondary | ICD-10-CM | POA: Diagnosis present

## 2022-04-04 DIAGNOSIS — M25572 Pain in left ankle and joints of left foot: Secondary | ICD-10-CM | POA: Diagnosis present

## 2022-04-04 DIAGNOSIS — M1712 Unilateral primary osteoarthritis, left knee: Secondary | ICD-10-CM | POA: Diagnosis present

## 2022-04-04 DIAGNOSIS — S86312A Strain of muscle(s) and tendon(s) of peroneal muscle group at lower leg level, left leg, initial encounter: Secondary | ICD-10-CM | POA: Diagnosis present

## 2022-04-04 DIAGNOSIS — N132 Hydronephrosis with renal and ureteral calculous obstruction: Secondary | ICD-10-CM | POA: Diagnosis present

## 2022-04-04 DIAGNOSIS — S8992XA Unspecified injury of left lower leg, initial encounter: Secondary | ICD-10-CM | POA: Diagnosis not present

## 2022-04-04 DIAGNOSIS — E669 Obesity, unspecified: Secondary | ICD-10-CM | POA: Diagnosis present

## 2022-04-04 DIAGNOSIS — Z6837 Body mass index (BMI) 37.0-37.9, adult: Secondary | ICD-10-CM | POA: Diagnosis not present

## 2022-04-04 DIAGNOSIS — Z886 Allergy status to analgesic agent status: Secondary | ICD-10-CM | POA: Diagnosis not present

## 2022-04-04 DIAGNOSIS — S50311A Abrasion of right elbow, initial encounter: Secondary | ICD-10-CM | POA: Diagnosis present

## 2022-04-04 DIAGNOSIS — S80212A Abrasion, left knee, initial encounter: Secondary | ICD-10-CM | POA: Diagnosis present

## 2022-04-04 DIAGNOSIS — E8809 Other disorders of plasma-protein metabolism, not elsewhere classified: Secondary | ICD-10-CM | POA: Diagnosis present

## 2022-04-04 DIAGNOSIS — M170 Bilateral primary osteoarthritis of knee: Secondary | ICD-10-CM | POA: Diagnosis not present

## 2022-04-04 DIAGNOSIS — M436 Torticollis: Secondary | ICD-10-CM | POA: Diagnosis present

## 2022-04-04 DIAGNOSIS — M25511 Pain in right shoulder: Secondary | ICD-10-CM | POA: Diagnosis present

## 2022-04-04 DIAGNOSIS — M1990 Unspecified osteoarthritis, unspecified site: Secondary | ICD-10-CM

## 2022-04-04 DIAGNOSIS — D649 Anemia, unspecified: Secondary | ICD-10-CM | POA: Diagnosis present

## 2022-04-04 DIAGNOSIS — S83412A Sprain of medial collateral ligament of left knee, initial encounter: Secondary | ICD-10-CM | POA: Diagnosis present

## 2022-04-04 DIAGNOSIS — R519 Headache, unspecified: Secondary | ICD-10-CM | POA: Diagnosis present

## 2022-04-04 MED ORDER — ADULT MULTIVITAMIN W/MINERALS CH
1.0000 | ORAL_TABLET | Freq: Every day | ORAL | Status: DC
  Administered 2022-04-04 – 2022-04-10 (×7): 1 via ORAL
  Filled 2022-04-04 (×7): qty 1

## 2022-04-04 MED ORDER — LORATADINE 10 MG PO TABS
10.0000 mg | ORAL_TABLET | Freq: Every day | ORAL | Status: DC
Start: 2022-04-04 — End: 2022-04-11
  Administered 2022-04-04 – 2022-04-10 (×7): 10 mg via ORAL
  Filled 2022-04-04 (×7): qty 1

## 2022-04-04 MED ORDER — FENTANYL CITRATE PF 50 MCG/ML IJ SOSY
25.0000 ug | PREFILLED_SYRINGE | Freq: Once | INTRAMUSCULAR | Status: DC
  Filled 2022-04-04: qty 1

## 2022-04-04 MED ORDER — ACETAMINOPHEN 650 MG RE SUPP: 650.0000 mg | Freq: Four times a day (QID) | RECTAL | Status: DC | PRN

## 2022-04-04 MED ORDER — HYDROMORPHONE HCL 1 MG/ML IJ SOLN
0.5000 mg | INTRAMUSCULAR | Status: DC | PRN
  Administered 2022-04-04 – 2022-04-09 (×14): 0.5 mg via INTRAVENOUS
  Filled 2022-04-04 (×3): qty 0.5
  Filled 2022-04-04: qty 1
  Filled 2022-04-04 (×11): qty 0.5

## 2022-04-04 MED ORDER — GLUCOSAMINE-CHONDROITIN 250-200 MG PO CAPS: ORAL_CAPSULE | Freq: Every day | ORAL | Status: DC

## 2022-04-04 MED ORDER — ACETAMINOPHEN 325 MG PO TABS: 650.0000 mg | ORAL_TABLET | Freq: Four times a day (QID) | ORAL | Status: DC | PRN

## 2022-04-04 MED ORDER — OXYCODONE-ACETAMINOPHEN 5-325 MG PO TABS
2.0000 | ORAL_TABLET | Freq: Once | ORAL | Status: AC
Start: 2022-04-04 — End: 2022-04-04
  Administered 2022-04-04: 2 via ORAL
  Filled 2022-04-04: qty 2

## 2022-04-04 MED ORDER — ENOXAPARIN SODIUM 60 MG/0.6ML IJ SOSY
60.0000 mg | PREFILLED_SYRINGE | INTRAMUSCULAR | Status: DC
  Administered 2022-04-04 – 2022-04-09 (×6): 60 mg via SUBCUTANEOUS
  Filled 2022-04-04 (×6): qty 0.6

## 2022-04-04 MED ORDER — OXYCODONE HCL 5 MG PO TABS
5.0000 mg | ORAL_TABLET | Freq: Four times a day (QID) | ORAL | Status: DC | PRN
  Administered 2022-04-06 – 2022-04-10 (×5): 5 mg via ORAL
  Filled 2022-04-04 (×6): qty 1
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
## 2022-04-04 NOTE — ED Notes (Signed)
Pt set up for breakfast.
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
## 2022-04-04 NOTE — TOC CAGE-AID Note (Signed)
Transition of Care [facility]) - CAGE-AID Screening   Patient Details  Name: Sara Roberson MRN: [id number] Date of Birth: [birth year]  [facility]) CM/SW Contact:    Sara Roberson, LCSWA Phone Number: 04/04/2022, 9:46 AM   Clinical Narrative: Pt participated in Cage-Aid.  Pt stated she does not use substance or ETOH.  Pt was not offered resources, due to no usage of substance or ETOH.     Sara Roberson, MSW, LCSW-A Pronouns:  She/Her/Hers [facility] HealthTransitions of Care Clinical Social Worker Direct Number:  [phone] [email or web address]   CAGE-AID Screening:    Have You Ever Felt You Ought to Cut Down on Your Drinking or Drug Use?: No Have People Annoyed You By [employer] Your Drinking Or Drug Use?: No Have You Felt Bad Or Guilty About Your Drinking Or Drug Use?: No Have You Ever Had a Drink or Used Drugs First Thing In The Morning to Steady Your Nerves or to Get Rid of a Hangover?: No CAGE-AID Score: 0  Substance Abuse Education Offered: No

## 2022-04-04 NOTE — H&P (Addendum)
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 normal bone mineralization. There is no evidence of fracture or dislocation. There is mild spurring of the [facility] joint, inferior glenohumeral joint, and greater tuberosity. There is preservation of the normal acromiohumeral space. No displaced fracture seen in the visualized ribs. Left thumb series: Normal bone mineralization. There is mild soft tissue swelling in the thumb. There is no evidence of fracture or dislocation. There is small well corticated ossicle at the lateral edge of the thumb IP joint which is probably either due to a remote chip fracture or an unfused ossification center. There is mild narrowing and spurring of the first CMC and thumb IP joints. Left tibia fibula series: There is stranding subcutaneous edema in the foreleg, greatest at the ankle level. No fracture or focal bone lesion is seen. 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 There are subcutaneous phleboliths in the distal foreleg. Left ankle: Standard three views revealing moderate to severe soft tissue swelling at the ankle extending into  foot. There is mild arthrosis of the ankle without erosive arthropathy. There is normal bone mineralization without evidence of fractures. There are small plantar and posterior calcaneal noninflammatory enthesopathic spurs and moderate arthrosis in the superior mid foot on the lateral view. IMPRESSION: 1. No evidence of fracture or dislocation in the right shoulder, left thumb, left foreleg and left ankle. 2. Mild soft tissue swelling in the left thumb. 3. Stranding edema in the foreleg and moderate to severe edema at the ankle and hindfoot. 4. Degenerative changes described above. Electronically Signed   By: Telford Nab M.D.   On: 04/03/2022 23:12    EKG: None  Assessment/Plan Principal Problem:   Left knee  pain Active Problems:   Acute pain of left knee   Osteoarthritis  (please populate well all problems here in Problem List. (For example, if patient is on BP meds at home and you resume or decide to hold them, it is a problem that needs to be her. Same for CAD, COPD, HLD and so on)  Acute left knee pain -Failed outpatient pain management and initial physical therapy -Abdominal trauma, fracture dislocation ruled out.  Soft tissue damage including cartilage and/or ligament injury cannot be ruled out.  Discussed with patient about outpatient MRI in 4 to 6 weeks if left knee/ankle pain persist to rule out ligament/meniscus damages.  Patient expressed understanding and agreed.  Right ureter obstructing stone with hydronephrosis -Discussed with on-call urology Dr. Abner Greenspan, recommend outpatient lithotripsy, based on no acute symptoms such as pain or kidney function changes.  Urologist office contact information provided in discharge instructions.  Elevated glucose -No history of diabetes, recommend outpatient PCP follow-up for A1c and rule out diabetes.  Obesity -Recommend calorie control  DVT prophylaxis: Lovenox Family Communication: Niece at bedside Disposition Plan: Expect more than 2  midnight hospital stay for pain management and physical therapy, expect discharge to rehab. Consults called: Urology Admission status: MedSurg admission   Lequita Halt MD [facility] Pager [phone]  04/04/2022, 12:45 PM

## 2022-04-05 ENCOUNTER — Inpatient Hospital Stay (HOSPITAL_COMMUNITY): Payer: Medicare Other

## 2022-04-05 DIAGNOSIS — E669 Obesity, unspecified: Secondary | ICD-10-CM

## 2022-04-05 DIAGNOSIS — M25562 Pain in left knee: Secondary | ICD-10-CM | POA: Diagnosis not present

## 2022-04-05 DIAGNOSIS — D649 Anemia, unspecified: Secondary | ICD-10-CM | POA: Diagnosis not present

## 2022-04-05 LAB — CBC WITH DIFFERENTIAL/PLATELET
Abs Immature Granulocytes: 0.01 10*3/uL (ref 0.00–0.07)
Basophils Absolute: 0 10*3/uL (ref 0.0–0.1)
Basophils Relative: 1 %
Eosinophils Absolute: 0.2 10*3/uL (ref 0.0–0.5)
Eosinophils Relative: 3 %
HCT: 34.1 % — ABNORMAL LOW (ref 36.0–46.0)
Hemoglobin: 11.2 g/dL — ABNORMAL LOW (ref 12.0–15.0)
Immature Granulocytes: 0 %
Lymphocytes Relative: 18 %
Lymphs Abs: 1 10*3/uL (ref 0.7–4.0)
MCH: 31.3 pg (ref 26.0–34.0)
MCHC: 32.8 g/dL (ref 30.0–36.0)
MCV: 95.3 fL (ref 80.0–100.0)
Monocytes Absolute: 1 10*3/uL (ref 0.1–1.0)
Monocytes Relative: 18 %
Neutro Abs: 3.6 10*3/uL (ref 1.7–7.7)
Neutrophils Relative %: 60 %
Platelets: 200 10*3/uL (ref 150–400)
RBC: 3.58 MIL/uL — ABNORMAL LOW (ref 3.87–5.11)
RDW: 12.5 % (ref 11.5–15.5)
WBC: 5.9 10*3/uL (ref 4.0–10.5)
nRBC: 0 % (ref 0.0–0.2)

## 2022-04-05 LAB — COMPREHENSIVE METABOLIC PANEL
ALT: 18 U/L (ref 0–44)
AST: 18 U/L (ref 15–41)
Albumin: 2.7 g/dL — ABNORMAL LOW (ref 3.5–5.0)
Alkaline Phosphatase: 46 U/L (ref 38–126)
Anion gap: 6 (ref 5–15)
BUN: 14 mg/dL (ref 8–23)
CO2: 22 mmol/L (ref 22–32)
Calcium: 9.1 mg/dL (ref 8.9–10.3)
Chloride: 109 mmol/L (ref 98–111)
Creatinine, Ser: 0.83 mg/dL (ref 0.44–1.00)
GFR, Estimated: >60 mL/min (ref >60)
Glucose, Bld: 122 mg/dL — ABNORMAL HIGH (ref 70–99)
Potassium: 3.8 mmol/L (ref 3.5–5.1)
Sodium: 137 mmol/L (ref 135–145)
Total Bilirubin: 0.4 mg/dL (ref 0.3–1.2)
Total Protein: 5.5 g/dL — ABNORMAL LOW (ref 6.5–8.1)

## 2022-04-05 LAB — PHOSPHORUS: Phosphorus: 2.8 mg/dL (ref 2.5–4.6)

## 2022-04-05 LAB — MAGNESIUM: Magnesium: 1.7 mg/dL (ref 1.7–2.4)

## 2022-04-05 MED ORDER — LORAZEPAM 2 MG/ML IJ SOLN
1.0000 mg | Freq: Once | INTRAMUSCULAR | Status: AC | PRN
  Administered 2022-04-05: 1 mg via INTRAVENOUS
  Filled 2022-04-05: qty 1

## 2022-04-05 MED ORDER — ALUM & MAG HYDROXIDE-SIMETH 200-200-20 MG/5ML PO SUSP
15.0000 mL | Freq: Four times a day (QID) | ORAL | Status: DC | PRN
  Administered 2022-04-05 – 2022-04-08 (×2): 15 mL via ORAL
  Filled 2022-04-05 (×2): qty 30
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
## 2022-04-05 NOTE — NC FL2 (Signed)
[place] MEDICAID FL2 LEVEL OF CARE SCREENING TOOL     IDENTIFICATION  Patient Name: Sara Roberson Birthdate: 06-06-1946 Sex: female Admission Date (Current Location): 04/03/2022  [facility] and [place] Number:  Producer, television/film/video and Address:  [facility] [place], [address] [place], [place] [address]      Provider Number: [id number]  Attending Physician Name and Address:  Merlene Laughter, DO  Relative Name and Phone Number:  Griselda Miner Niece [phone]    Current Level of Care: Hospital Recommended Level of Care: Skilled Nursing Facility Prior Approval Number:    Date Approved/Denied:   PASRR Number: [id number]  Discharge Plan: SNF    Current Diagnoses: Patient Active Problem List   Diagnosis Date Noted   Acute pain of left knee 04/04/2022   Osteoarthritis 04/04/2022   Left knee pain 04/04/2022    Orientation RESPIRATION BLADDER Height & Weight     Self, Time, Situation, Place  Normal Continent Weight: 263 lb 12.1 oz (119.6 kg) Height:  5\' 10"  (177.8 cm)  BEHAVIORAL SYMPTOMS/MOOD NEUROLOGICAL BOWEL NUTRITION STATUS      Continent Diet (see discharge summary)  AMBULATORY STATUS COMMUNICATION OF NEEDS Skin   Total Care Verbally Skin abrasions                       Personal Care Assistance Level of Assistance  Bathing, Feeding, Dressing, Total care Bathing Assistance: Maximum assistance Feeding assistance: Independent Dressing Assistance: Maximum assistance Total Care Assistance: Maximum assistance   Functional Limitations Info  Sight, Hearing, Speech Sight Info: Adequate Hearing Info: Adequate Speech Info: Adequate    SPECIAL CARE FACTORS FREQUENCY  PT (By licensed PT), OT (By licensed OT)     PT Frequency: 5x week OT Frequency: 5x week            Contractures Contractures Info: Not present    Additional Factors Info  Code Status, Allergies Code Status Info: full Allergies Info: aspirin            Current Medications (04/05/2022):  This is the current hospital active medication list Current Facility-Administered Medications  Medication Dose Route Frequency Provider Last Rate Last Admin   0.9 %  sodium chloride infusion   Intravenous Continuous 04/07/2022, MD 50 mL/hr at 04/04/22 1036 Rate Verify at 04/04/22 1036   acetaminophen (TYLENOL) tablet 650 mg  650 mg Oral Q6H PRN 04/06/22, MD       Or   acetaminophen (TYLENOL) suppository 650 mg  650 mg Rectal Q6H PRN Emeline General T, MD       enoxaparin (LOVENOX) injection 60 mg  60 mg Subcutaneous Q24H Mikey College T, MD   60 mg at 04/04/22 1555   fentaNYL (SUBLIMAZE) injection 25 mcg  25 mcg Intravenous Once 04/06/22, MD       HYDROmorphone (DILAUDID) injection 0.5 mg  0.5 mg Intravenous Q4H PRN Margarita Grizzle T, MD   0.5 mg at 04/05/22 0909   loratadine (CLARITIN) tablet 10 mg  10 mg Oral Daily 04/07/22 T, MD   10 mg at 04/05/22 0909   LORazepam (ATIVAN) injection 1 mg  1 mg Intravenous Once PRN 04/07/22 Latif, DO       multivitamin with minerals tablet 1 tablet  1 tablet Oral Daily 06-20-2004 T, MD   1 tablet at 04/05/22 04/07/22   oxyCODONE (Oxy IR/ROXICODONE) immediate release tablet 5 mg  5 mg Oral Q6H PRN 1030 T,  MD         Discharge Medications: Please see discharge summary for a list of discharge medications.  Relevant Imaging Results:  Relevant Lab Results:   Additional Information SSN: [id number], p is vaccinated for covid with 2 boosters.  Lorri Frederick, LCSW

## 2022-04-05 NOTE — Progress Notes (Signed)
Consult received Imaging studies on knee and ankle pending CT scan reviewed from yesterday shows no fracture but significant arthritis with mild effusion Full evaluation to follow
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
## 2022-04-06 ENCOUNTER — Inpatient Hospital Stay (HOSPITAL_COMMUNITY): Payer: Medicare Other

## 2022-04-06 DIAGNOSIS — D649 Anemia, unspecified: Secondary | ICD-10-CM | POA: Diagnosis not present

## 2022-04-06 DIAGNOSIS — M25572 Pain in left ankle and joints of left foot: Secondary | ICD-10-CM | POA: Diagnosis not present

## 2022-04-06 DIAGNOSIS — S8992XA Unspecified injury of left lower leg, initial encounter: Secondary | ICD-10-CM

## 2022-04-06 DIAGNOSIS — M170 Bilateral primary osteoarthritis of knee: Secondary | ICD-10-CM

## 2022-04-06 DIAGNOSIS — M25562 Pain in left knee: Secondary | ICD-10-CM | POA: Diagnosis not present

## 2022-04-06 LAB — CBC WITH DIFFERENTIAL/PLATELET
Abs Immature Granulocytes: 0.03 10*3/uL (ref 0.00–0.07)
Basophils Absolute: 0 10*3/uL (ref 0.0–0.1)
Basophils Relative: 0 %
Eosinophils Absolute: 0.2 10*3/uL (ref 0.0–0.5)
Eosinophils Relative: 2 %
HCT: 37.9 % (ref 36.0–46.0)
Hemoglobin: 11.7 g/dL — ABNORMAL LOW (ref 12.0–15.0)
Immature Granulocytes: 0 %
Lymphocytes Relative: 14 %
Lymphs Abs: 1 10*3/uL (ref 0.7–4.0)
MCH: 29.9 pg (ref 26.0–34.0)
MCHC: 30.9 g/dL (ref 30.0–36.0)
MCV: 96.9 fL (ref 80.0–100.0)
Monocytes Absolute: 1.1 10*3/uL — ABNORMAL HIGH (ref 0.1–1.0)
Monocytes Relative: 16 %
Neutro Abs: 4.7 10*3/uL (ref 1.7–7.7)
Neutrophils Relative %: 68 %
Platelets: 174 10*3/uL (ref 150–400)
RBC: 3.91 MIL/uL (ref 3.87–5.11)
RDW: 12.7 % (ref 11.5–15.5)
WBC: 7 10*3/uL (ref 4.0–10.5)
nRBC: 0 % (ref 0.0–0.2)

## 2022-04-06 LAB — COMPREHENSIVE METABOLIC PANEL
ALT: 19 U/L (ref 0–44)
AST: 20 U/L (ref 15–41)
Albumin: 3 g/dL — ABNORMAL LOW (ref 3.5–5.0)
Alkaline Phosphatase: 47 U/L (ref 38–126)
Anion gap: 9 (ref 5–15)
BUN: 13 mg/dL (ref 8–23)
CO2: 23 mmol/L (ref 22–32)
Calcium: 9.3 mg/dL (ref 8.9–10.3)
Chloride: 106 mmol/L (ref 98–111)
Creatinine, Ser: 0.96 mg/dL (ref 0.44–1.00)
GFR, Estimated: >60 mL/min (ref >60)
Glucose, Bld: 101 mg/dL — ABNORMAL HIGH (ref 70–99)
Potassium: 4.1 mmol/L (ref 3.5–5.1)
Sodium: 138 mmol/L (ref 135–145)
Total Bilirubin: 0.5 mg/dL (ref 0.3–1.2)
Total Protein: 6 g/dL — ABNORMAL LOW (ref 6.5–8.1)

## 2022-04-06 LAB — FOLATE: Folate: 23.9 ng/mL (ref >5.9)

## 2022-04-06 LAB — IRON AND TIBC
Iron: 24 ug/dL — ABNORMAL LOW (ref 28–170)
Saturation Ratios: 9 % — ABNORMAL LOW (ref 10.4–31.8)
TIBC: 262 ug/dL (ref 250–450)
UIBC: 238 ug/dL

## 2022-04-06 LAB — PHOSPHORUS: Phosphorus: 3.1 mg/dL (ref 2.5–4.6)

## 2022-04-06 LAB — RETICULOCYTES
Immature Retic Fract: 17.5 % — ABNORMAL HIGH (ref 2.3–15.9)
RBC.: 3.96 MIL/uL (ref 3.87–5.11)
Retic Count, Absolute: 50.3 10*3/uL (ref 19.0–186.0)
Retic Ct Pct: 1.3 % (ref 0.4–3.1)

## 2022-04-06 LAB — FERRITIN: Ferritin: 125 ng/mL (ref 11–307)

## 2022-04-06 LAB — MAGNESIUM: Magnesium: 1.6 mg/dL — ABNORMAL LOW (ref 1.7–2.4)

## 2022-04-06 LAB — VITAMIN B12: Vitamin B-12: 481 pg/mL (ref 180–914)

## 2022-04-06 MED ORDER — MAGNESIUM SULFATE 2 GM/50ML IV SOLN
2.0000 g | Freq: Once | INTRAVENOUS | Status: AC
  Administered 2022-04-06: 2 g via INTRAVENOUS
  Filled 2022-04-06: qty 50

## 2022-04-06 NOTE — Progress Notes (Signed)
Orthopedic Tech Progress Note Patient Details:  Sara Roberson 1946-08-10 [id number] Unable to apply a CPM to her left knee due to pain. I will try again at a later time  Patient ID: Sara Roberson, female   DOB: [birth year], 76 y.o.   MRN: [id number]  Smitty Pluck 04/06/2022, 12:39 PM
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
## 2022-04-06 NOTE — Progress Notes (Signed)
Pt refused mobility earlier; refused to put her knee immobilizer on and use CPM.
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
## 2022-04-06 NOTE — Plan of Care (Signed)
  Problem: Education: Goal: Knowledge of General Education information will improve Description: Including pain rating scale, medication(s)/side effects and non-pharmacologic comfort measures Outcome: Progressing   Problem: Nutrition: Goal: Adequate nutrition will be maintained Outcome: Progressing   Problem: Safety: Goal: Ability to remain free from injury will improve Outcome: Progressing   

## 2022-04-06 NOTE — TOC Progression Note (Signed)
Transition of Care [facility]) - Progression Note    Patient Details  Name: Sara Roberson MRN: [id number] Date of Birth: [birth year]  [facility] CM/SW Contact  Joanne Chars, LCSW Phone Number: 04/06/2022, 10:48 AM  Clinical Narrative:   CSW met with pt and provided current bed offers.  She is asking for response from [facility], [facility], and [facility].  CSW reached out to those facilities.     Expected Discharge Plan: [place] Barriers to Discharge: Continued Medical Work up, SNF Pending bed offer  Expected Discharge Plan and Services Expected Discharge Plan: [place] In-house Referral: Clinical Social Work   Post Acute Care Choice: [place] Living arrangements for the past 2 months: Single Family Home                                       Social Determinants of Health (SDOH) Interventions    Readmission Risk Interventions     View : No data to display.
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
## 2022-04-07 ENCOUNTER — Inpatient Hospital Stay (HOSPITAL_COMMUNITY): Payer: Medicare Other

## 2022-04-07 DIAGNOSIS — S8992XA Unspecified injury of left lower leg, initial encounter: Secondary | ICD-10-CM | POA: Diagnosis not present

## 2022-04-07 DIAGNOSIS — M25562 Pain in left knee: Secondary | ICD-10-CM | POA: Diagnosis not present

## 2022-04-07 DIAGNOSIS — D649 Anemia, unspecified: Secondary | ICD-10-CM | POA: Diagnosis not present

## 2022-04-07 DIAGNOSIS — M25572 Pain in left ankle and joints of left foot: Secondary | ICD-10-CM | POA: Diagnosis not present

## 2022-04-07 LAB — CBC WITH DIFFERENTIAL/PLATELET
Abs Immature Granulocytes: 0.03 10*3/uL (ref 0.00–0.07)
Basophils Absolute: 0 10*3/uL (ref 0.0–0.1)
Basophils Relative: 1 %
Eosinophils Absolute: 0.2 10*3/uL (ref 0.0–0.5)
Eosinophils Relative: 3 %
HCT: 36.2 % (ref 36.0–46.0)
Hemoglobin: 11.5 g/dL — ABNORMAL LOW (ref 12.0–15.0)
Immature Granulocytes: 1 %
Lymphocytes Relative: 23 %
Lymphs Abs: 1.5 10*3/uL (ref 0.7–4.0)
MCH: 30.2 pg (ref 26.0–34.0)
MCHC: 31.8 g/dL (ref 30.0–36.0)
MCV: 95 fL (ref 80.0–100.0)
Monocytes Absolute: 1 10*3/uL (ref 0.1–1.0)
Monocytes Relative: 15 %
Neutro Abs: 3.7 10*3/uL (ref 1.7–7.7)
Neutrophils Relative %: 57 %
Platelets: 237 10*3/uL (ref 150–400)
RBC: 3.81 MIL/uL — ABNORMAL LOW (ref 3.87–5.11)
RDW: 12.5 % (ref 11.5–15.5)
WBC: 6.4 10*3/uL (ref 4.0–10.5)
nRBC: 0 % (ref 0.0–0.2)

## 2022-04-07 LAB — COMPREHENSIVE METABOLIC PANEL
ALT: 20 U/L (ref 0–44)
AST: 17 U/L (ref 15–41)
Albumin: 2.7 g/dL — ABNORMAL LOW (ref 3.5–5.0)
Alkaline Phosphatase: 50 U/L (ref 38–126)
Anion gap: 6 (ref 5–15)
BUN: 16 mg/dL (ref 8–23)
CO2: 25 mmol/L (ref 22–32)
Calcium: 8.9 mg/dL (ref 8.9–10.3)
Chloride: 106 mmol/L (ref 98–111)
Creatinine, Ser: 0.97 mg/dL (ref 0.44–1.00)
GFR, Estimated: >60 mL/min (ref >60)
Glucose, Bld: 117 mg/dL — ABNORMAL HIGH (ref 70–99)
Potassium: 4.5 mmol/L (ref 3.5–5.1)
Sodium: 137 mmol/L (ref 135–145)
Total Bilirubin: 0.5 mg/dL (ref 0.3–1.2)
Total Protein: 5.9 g/dL — ABNORMAL LOW (ref 6.5–8.1)

## 2022-04-07 LAB — PHOSPHORUS: Phosphorus: 3.4 mg/dL (ref 2.5–4.6)

## 2022-04-07 LAB — MAGNESIUM: Magnesium: 2 mg/dL (ref 1.7–2.4)

## 2022-04-07 LAB — HEMOGLOBIN A1C
Hgb A1c MFr Bld: 5.7 % — ABNORMAL HIGH (ref 4.8–5.6)
Mean Plasma Glucose: 116.89 mg/dL

## 2022-04-07 MED ORDER — LORAZEPAM 2 MG/ML IJ SOLN
1.0000 mg | Freq: Once | INTRAMUSCULAR | Status: AC | PRN
  Administered 2022-04-07: 1 mg via INTRAVENOUS
  Filled 2022-04-07: qty 1

## 2022-04-07 MED ORDER — GADOBUTROL 1 MMOL/ML IV SOLN
10.0000 mL | Freq: Once | INTRAVENOUS | Status: AC | PRN
  Administered 2022-04-07: 10 mL via INTRAVENOUS
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
## 2022-04-07 NOTE — Progress Notes (Signed)
Patient has MCL avulsion by MRI scan on the left knee Her mobilization is very slow.  Did not tolerate CPM machine yesterday. I will request a hinged knee brace for her but she is fine to bend the knee and weight-bear as tolerated in the brace but I suspect that she will be very slow to mobilize based on several days of examination.

## 2022-04-07 NOTE — Progress Notes (Signed)
Orthopedic Tech Progress Note Patient Details:  Sara Roberson 03-Jun-1946 [id number]  Ortho Devices Type of Ortho Device: Knee splint Ortho Device/Splint Location: LLE Ortho Device/Splint Interventions: Ordered, Application, Adjustment   Post Interventions Patient Tolerated: Poor Hinged knee brace applied, patient did not do well and was concerned with the abrasion on her left knee not being looked at with the brace on.  [name] 04/07/2022, 3:29 PM

## 2022-04-08 DIAGNOSIS — D649 Anemia, unspecified: Secondary | ICD-10-CM | POA: Diagnosis not present

## 2022-04-08 DIAGNOSIS — M25562 Pain in left knee: Secondary | ICD-10-CM | POA: Diagnosis not present

## 2022-04-08 DIAGNOSIS — M25572 Pain in left ankle and joints of left foot: Secondary | ICD-10-CM | POA: Diagnosis not present

## 2022-04-08 DIAGNOSIS — S8992XA Unspecified injury of left lower leg, initial encounter: Secondary | ICD-10-CM | POA: Diagnosis not present

## 2022-04-08 LAB — CBC WITH DIFFERENTIAL/PLATELET
Abs Immature Granulocytes: 0.03 10*3/uL (ref 0.00–0.07)
Basophils Absolute: 0 10*3/uL (ref 0.0–0.1)
Basophils Relative: 1 %
Eosinophils Absolute: 0.2 10*3/uL (ref 0.0–0.5)
Eosinophils Relative: 4 %
HCT: 38.6 % (ref 36.0–46.0)
Hemoglobin: 12.1 g/dL (ref 12.0–15.0)
Immature Granulocytes: 1 %
Lymphocytes Relative: 28 %
Lymphs Abs: 1.5 10*3/uL (ref 0.7–4.0)
MCH: 30.1 pg (ref 26.0–34.0)
MCHC: 31.3 g/dL (ref 30.0–36.0)
MCV: 96 fL (ref 80.0–100.0)
Monocytes Absolute: 0.8 10*3/uL (ref 0.1–1.0)
Monocytes Relative: 15 %
Neutro Abs: 2.8 10*3/uL (ref 1.7–7.7)
Neutrophils Relative %: 51 %
Platelets: 281 10*3/uL (ref 150–400)
RBC: 4.02 MIL/uL (ref 3.87–5.11)
RDW: 12.6 % (ref 11.5–15.5)
WBC: 5.3 10*3/uL (ref 4.0–10.5)
nRBC: 0 % (ref 0.0–0.2)

## 2022-04-08 LAB — COMPREHENSIVE METABOLIC PANEL
ALT: 23 U/L (ref 0–44)
AST: 20 U/L (ref 15–41)
Albumin: 2.8 g/dL — ABNORMAL LOW (ref 3.5–5.0)
Alkaline Phosphatase: 50 U/L (ref 38–126)
Anion gap: 4 — ABNORMAL LOW (ref 5–15)
BUN: 21 mg/dL (ref 8–23)
CO2: 27 mmol/L (ref 22–32)
Calcium: 9.5 mg/dL (ref 8.9–10.3)
Chloride: 107 mmol/L (ref 98–111)
Creatinine, Ser: 0.97 mg/dL (ref 0.44–1.00)
GFR, Estimated: >60 mL/min (ref >60)
Glucose, Bld: 117 mg/dL — ABNORMAL HIGH (ref 70–99)
Potassium: 4.6 mmol/L (ref 3.5–5.1)
Sodium: 138 mmol/L (ref 135–145)
Total Bilirubin: 0.5 mg/dL (ref 0.3–1.2)
Total Protein: 6.5 g/dL (ref 6.5–8.1)

## 2022-04-08 LAB — PHOSPHORUS: Phosphorus: 4.3 mg/dL (ref 2.5–4.6)

## 2022-04-08 LAB — MAGNESIUM: Magnesium: 1.8 mg/dL (ref 1.7–2.4)

## 2022-04-08 MED ORDER — MAGNESIUM SULFATE 2 GM/50ML IV SOLN
2.0000 g | Freq: Once | INTRAVENOUS | Status: AC
  Administered 2022-04-08: 2 g via INTRAVENOUS
  Filled 2022-04-08: qty 50

## 2022-04-08 NOTE — Progress Notes (Signed)
Mobility Specialist Progress Note   04/08/22 1924  Mobility  Activity Turned to back - supine (Bed Level PROM)  Range of Motion/Exercises Left leg  Level of Assistance Moderate assist, patient does 50-74%  Assistive Device  (HHA)  Activity Response Tolerated well  $Mobility charge 1 Mobility   Pt limited by pain this session but had just recently received meds so agreeable. Worked on tolerance to passive range of motion and pt able to finish full session. Pt claiming to have more range than yesterday but still notes it as a 9/10 in pain. Left sitting up in bed to eat dinner w/ call bell in reach.  Frederico Hamman Mobility Specialist Phone Number [phone]

## 2022-04-08 NOTE — Progress Notes (Signed)
OT Cancellation Note  Patient Details Name: REMA LIEVANOS MRN: [id number] DOB: [birth year]   Cancelled Treatment:    Reason Eval/Treat Not Completed: Other (comment) Pt awaiting lunch arrival at [facility] prior to taking pain medications (needed for successful therapy participation). If unable to check back this PM for OT session, will follow-up tomorrow 5/23.  Lorre Munroe 04/08/2022, 1:26 PM

## 2022-04-08 NOTE — TOC Progression Note (Signed)
Transition of Care [facility]) - Progression Note    Patient Details  Name: Sara Roberson MRN: [id number] Date of Birth: Aug 17, 1946  Transition of Care [facility]) CM/SW Contact  Lorri Frederick, LCSW Phone Number: 04/08/2022, 10:46 AM  Clinical Narrative:   CSW spoke with Velna Hatchet at [facility] and [facility] [place].  Neither can make bed offer.  Pt requesting that she be evaluated for CIR admit, CSW spoke with Caitlin/CIR and she will review for possible admit.      Expected Discharge Plan: Skilled Nursing Facility Barriers to Discharge: Continued Medical Work up, SNF Pending bed offer  Expected Discharge Plan and Services Expected Discharge Plan: Skilled Nursing Facility In-house Referral: Clinical Social Work   Post Acute Care Choice: Skilled Nursing Facility Living arrangements for the past 2 months: Single Family Home                                       Social Determinants of Health (SDOH) Interventions    Readmission Risk Interventions     View : No data to display.

## 2022-04-08 NOTE — Care Management Important Message (Signed)
Important Message  Patient Details  Name: Sara Roberson MRN: [id number] Date of Birth: 01/10/1946   Medicare Important Message Given:  Yes     Dorena Bodo 04/08/2022, 3:49 PM

## 2022-04-08 NOTE — Progress Notes (Signed)
Inpatient Rehab Admissions Coordinator:   Per [facility] request pt was screened for CIR candidacy by Estill Dooms, PT, DPT.  Discussed with Dr. Riley Kill who feel that pt does have medical necessity to support CIR admit.  Awaiting f/u therapy session to assess progress and will determine eligibility.   Estill Dooms, PT, DPT Admissions Coordinator [phone] 04/08/22  3:05 PM
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
## 2022-04-09 DIAGNOSIS — M25562 Pain in left knee: Secondary | ICD-10-CM | POA: Diagnosis not present

## 2022-04-09 DIAGNOSIS — S8992XA Unspecified injury of left lower leg, initial encounter: Secondary | ICD-10-CM | POA: Diagnosis not present

## 2022-04-09 DIAGNOSIS — D649 Anemia, unspecified: Secondary | ICD-10-CM | POA: Diagnosis not present

## 2022-04-09 DIAGNOSIS — M25572 Pain in left ankle and joints of left foot: Secondary | ICD-10-CM | POA: Diagnosis not present

## 2022-04-09 LAB — PHOSPHORUS: Phosphorus: 4 mg/dL (ref 2.5–4.6)

## 2022-04-09 LAB — CBC WITH DIFFERENTIAL/PLATELET
Abs Immature Granulocytes: 0.02 10*3/uL (ref 0.00–0.07)
Basophils Absolute: 0 10*3/uL (ref 0.0–0.1)
Basophils Relative: 1 %
Eosinophils Absolute: 0.2 10*3/uL (ref 0.0–0.5)
Eosinophils Relative: 4 %
HCT: 37.5 % (ref 36.0–46.0)
Hemoglobin: 11.8 g/dL — ABNORMAL LOW (ref 12.0–15.0)
Immature Granulocytes: 0 %
Lymphocytes Relative: 25 %
Lymphs Abs: 1.3 10*3/uL (ref 0.7–4.0)
MCH: 29.9 pg (ref 26.0–34.0)
MCHC: 31.5 g/dL (ref 30.0–36.0)
MCV: 94.9 fL (ref 80.0–100.0)
Monocytes Absolute: 0.7 10*3/uL (ref 0.1–1.0)
Monocytes Relative: 14 %
Neutro Abs: 2.9 10*3/uL (ref 1.7–7.7)
Neutrophils Relative %: 56 %
Platelets: 287 10*3/uL (ref 150–400)
RBC: 3.95 MIL/uL (ref 3.87–5.11)
RDW: 12.4 % (ref 11.5–15.5)
WBC: 5.3 10*3/uL (ref 4.0–10.5)
nRBC: 0 % (ref 0.0–0.2)

## 2022-04-09 LAB — COMPREHENSIVE METABOLIC PANEL
ALT: 29 U/L (ref 0–44)
AST: 23 U/L (ref 15–41)
Albumin: 2.9 g/dL — ABNORMAL LOW (ref 3.5–5.0)
Alkaline Phosphatase: 51 U/L (ref 38–126)
Anion gap: 6 (ref 5–15)
BUN: 20 mg/dL (ref 8–23)
CO2: 23 mmol/L (ref 22–32)
Calcium: 9.1 mg/dL (ref 8.9–10.3)
Chloride: 109 mmol/L (ref 98–111)
Creatinine, Ser: 0.94 mg/dL (ref 0.44–1.00)
GFR, Estimated: >60 mL/min (ref >60)
Glucose, Bld: 122 mg/dL — ABNORMAL HIGH (ref 70–99)
Potassium: 4.6 mmol/L (ref 3.5–5.1)
Sodium: 138 mmol/L (ref 135–145)
Total Bilirubin: 0.7 mg/dL (ref 0.3–1.2)
Total Protein: 6.2 g/dL — ABNORMAL LOW (ref 6.5–8.1)

## 2022-04-09 LAB — MAGNESIUM: Magnesium: 1.9 mg/dL (ref 1.7–2.4)
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
## 2022-04-09 NOTE — Progress Notes (Signed)
Mobility Specialist Progress Note   04/09/22 1240  Mobility  Activity Turned to left side;Turned to right side;Turned to back - supine  Level of Assistance +2 (takes two people)  Assistive Device  (HHA)  Activity Response Tolerated well  $Mobility charge 1 Mobility   Pt requesting to be repositioned and shifted up in bed. Requiring pt to roll on right and left side to readjust pads underneath pt to help w/ maneuvering. Left in comfortable position w/ call bell in reach. Will return later to day to f/u w/ mobility session   Holland Falling Mobility Specialist Phone Number [phone]

## 2022-04-09 NOTE — TOC Progression Note (Addendum)
Transition of Care [facility]) - Progression Note    Patient Details  Name: Sara Roberson MRN: [id number] Date of Birth: [birth year]  [facility] CM/SW Contact  Lorri Frederick, LCSW Phone Number: 04/09/2022, 11:32 AM  Clinical Narrative:   CSW spoke with Sara Roberson/CIR who reviewed OT notes, does not think pt appropriate for CIR.  Pt informed, discussed options.  Pt has new bed offer for SNF from [facility] today, CSW provided medicare rating information.  Pt continues to be hesitant for SNF and to say there is no way she can manage at home currently.  Discussed that those are the two options, discussed if she could find adequate support to go home?  Pt reports he son is coming to the hospital shortly and she will discuss all this with him.   1330: CSW spoke with pt and son Maisie Fus, updated son on the placement situation, he asked for time to discuss this with his mother.  1515: CSW spoke again with pt and her son, she is in agreement with going to [place].  She is asking that she be allowed to stay inpt until Thursday, we discussed this is unlikely but she would like CSW to pass this along to MD, which was done.  Son planning to drive out and see [facility].  CSW confirmed with Meryle Ready at [facility] and they can accept pt tomorrow.   Expected Discharge Plan: Skilled Nursing Facility Barriers to Discharge: Continued Medical Work up, SNF Pending bed offer  Expected Discharge Plan and Services Expected Discharge Plan: Skilled Nursing Facility In-house Referral: Clinical Social Work   Post Acute Care Choice: Skilled Nursing Facility Living arrangements for the past 2 months: Single Family Home                                       Social Determinants of Health (SDOH) Interventions    Readmission Risk Interventions     View : No data to display.

## 2022-04-09 NOTE — Progress Notes (Signed)
Inpatient Rehab Admissions Coordinator:   Rescreened following OT session this AM.  Note pt continues to be significantly limited by pain and I agree that she will not be able to tolerate the intensity of a CIR program.  Concur with PT/OT recs for SNF at this time.    Estill Dooms, PT, DPT Admissions Coordinator [phone] 04/09/22  11:15 AM

## 2022-04-09 NOTE — Progress Notes (Signed)
Mobility Specialist Progress Note   04/09/22 1400  Mobility  Activity  (PROM)  Range of Motion/Exercises Passive;Left leg  Level of Assistance Minimal assist, patient does 75% or more  Assistive Device  (HHA)  Activity Response Tolerated well  $Mobility charge 1 Mobility   Received pt in bed having no pain(RN had given pain meds prior) and agreeable to some passive range of motion. Also performed x10 short arc quads + assisted single leg raises. Able to tolerate complete session w/ very little complaint of pain. Pt agreeable to putting LLE in CPM at 40 degrees for ~89mns. Tolerated well and left w/ all needs met.  JHolland FallingMobility Specialist Phone Number [phone]
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
## 2022-04-10 DIAGNOSIS — N201 Calculus of ureter: Secondary | ICD-10-CM

## 2022-04-10 DIAGNOSIS — N1330 Unspecified hydronephrosis: Secondary | ICD-10-CM | POA: Diagnosis present

## 2022-04-10 DIAGNOSIS — E8809 Other disorders of plasma-protein metabolism, not elsewhere classified: Secondary | ICD-10-CM

## 2022-04-10 DIAGNOSIS — M25562 Pain in left knee: Secondary | ICD-10-CM | POA: Diagnosis not present

## 2022-04-10 DIAGNOSIS — D649 Anemia, unspecified: Secondary | ICD-10-CM | POA: Diagnosis present

## 2022-04-10 DIAGNOSIS — N132 Hydronephrosis with renal and ureteral calculous obstruction: Secondary | ICD-10-CM | POA: Diagnosis not present

## 2022-04-10 LAB — CBC WITH DIFFERENTIAL/PLATELET
Abs Immature Granulocytes: 0.04 10*3/uL (ref 0.00–0.07)
Basophils Absolute: 0 10*3/uL (ref 0.0–0.1)
Basophils Relative: 1 %
Eosinophils Absolute: 0.2 10*3/uL (ref 0.0–0.5)
Eosinophils Relative: 4 %
HCT: 37.4 % (ref 36.0–46.0)
Hemoglobin: 12.1 g/dL (ref 12.0–15.0)
Immature Granulocytes: 1 %
Lymphocytes Relative: 21 %
Lymphs Abs: 1.2 10*3/uL (ref 0.7–4.0)
MCH: 30.5 pg (ref 26.0–34.0)
MCHC: 32.4 g/dL (ref 30.0–36.0)
MCV: 94.2 fL (ref 80.0–100.0)
Monocytes Absolute: 0.8 10*3/uL (ref 0.1–1.0)
Monocytes Relative: 14 %
Neutro Abs: 3.5 10*3/uL (ref 1.7–7.7)
Neutrophils Relative %: 59 %
Platelets: 295 10*3/uL (ref 150–400)
RBC: 3.97 MIL/uL (ref 3.87–5.11)
RDW: 12.3 % (ref 11.5–15.5)
WBC: 5.8 10*3/uL (ref 4.0–10.5)
nRBC: 0 % (ref 0.0–0.2)

## 2022-04-10 LAB — PHOSPHORUS: Phosphorus: 3.3 mg/dL (ref 2.5–4.6)

## 2022-04-10 LAB — COMPREHENSIVE METABOLIC PANEL
ALT: 30 U/L (ref 0–44)
AST: 24 U/L (ref 15–41)
Albumin: 3 g/dL — ABNORMAL LOW (ref 3.5–5.0)
Alkaline Phosphatase: 52 U/L (ref 38–126)
Anion gap: 4 — ABNORMAL LOW (ref 5–15)
BUN: 16 mg/dL (ref 8–23)
CO2: 26 mmol/L (ref 22–32)
Calcium: 9.4 mg/dL (ref 8.9–10.3)
Chloride: 106 mmol/L (ref 98–111)
Creatinine, Ser: 0.84 mg/dL (ref 0.44–1.00)
GFR, Estimated: >60 mL/min (ref >60)
Glucose, Bld: 106 mg/dL — ABNORMAL HIGH (ref 70–99)
Potassium: 4.4 mmol/L (ref 3.5–5.1)
Sodium: 136 mmol/L (ref 135–145)
Total Bilirubin: 0.6 mg/dL (ref 0.3–1.2)
Total Protein: 6.3 g/dL — ABNORMAL LOW (ref 6.5–8.1)

## 2022-04-10 LAB — MAGNESIUM: Magnesium: 1.9 mg/dL (ref 1.7–2.4)

## 2022-04-10 MED ORDER — SENNOSIDES-DOCUSATE SODIUM 8.6-50 MG PO TABS: 1.0000 | ORAL_TABLET | Freq: Two times a day (BID) | ORAL | Status: DC

## 2022-04-10 MED ORDER — OXYCODONE HCL 5 MG PO TABS: 5.0000 mg | ORAL_TABLET | Freq: Four times a day (QID) | ORAL | 0 refills | Status: DC | PRN

## 2022-04-10 MED ORDER — ACETAMINOPHEN 500 MG PO TABS
500.0000 mg | ORAL_TABLET | Freq: Three times a day (TID) | ORAL | Status: DC
  Administered 2022-04-10: 500 mg via ORAL
  Filled 2022-04-10: qty 1

## 2022-04-10 MED ORDER — ACETAMINOPHEN 500 MG PO TABS: 500.0000 mg | ORAL_TABLET | Freq: Three times a day (TID) | ORAL | 0 refills | Status: AC

## 2022-04-10 MED ORDER — CETIRIZINE-PSEUDOEPHEDRINE ER 5-120 MG PO TB12: 1.0000 | ORAL_TABLET | Freq: Every day | ORAL | 0 refills | Status: DC | PRN

## 2022-04-10 NOTE — Progress Notes (Signed)
Mobility Specialist Progress Note   04/10/22 1430  Mobility  Activity Ambulated with assistance to bathroom  Level of Assistance Moderate assist, patient does 50-74%  Assistive Device Front wheel walker  Distance Ambulated (ft) 14 ft  Activity Response Tolerated well  $Mobility charge 1 Mobility   Pt received in BR requesting assistance BTB. Mod A for initial stand from commode + mod VC on proper RW mechanics and posture when ambulating required. Made it back w/o fault w/ minimal pain expressed. Left supine in bed w/ call bell in reach and needs met.   Holland Falling Mobility Specialist Phone Number [phone]
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
## 2022-04-10 NOTE — Progress Notes (Signed)
Mobility Specialist Progress Note   04/10/22 1740  Mobility  Activity Transferred to/from [facility]  Level of Assistance +2 (takes two people)  Information systems manager Ambulated (ft) 2 ft  Activity Response Tolerated well  $Mobility charge 1 Mobility   Received in bed urgently requesting to use BSC. Requiring minA to get LLE to EOB, modA to stand from an elevated surface and modA to SPT to [facility]. VC were required for sequencing throughout d/t high levels of anxiety. Once on [facility] pt had a successful void and required +2A to stand and have pt cleaned for pericare. Returned back to bed w/ same levels of assist and made sure all needs were met.   Holland Falling Mobility Specialist Phone Number [phone]

## 2022-04-10 NOTE — Progress Notes (Signed)
Discharge instructions given to PTAR .
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
## 2022-04-10 NOTE — TOC Transition Note (Signed)
Transition of Care [facility]) - CM/SW Discharge Note   Patient Details  Name: Sara Roberson MRN: [id number] Date of Birth: May 04, 1946  [facility]) CM/SW Contact:  Joanne Chars, LCSW Phone Number: 04/10/2022, 1:37 PM   Clinical Narrative:   Pt discharging to [facility], room 39, RN call [phone] for report.     Final next level of care: Skilled Nursing Facility Barriers to Discharge: Barriers Resolved   Patient Goals and CMS Choice Patient states their goals for this hospitalization and ongoing recovery are:: get back to normal CMS Medicare.gov Compare Post Acute Care list provided to:: Patient Choice offered to / list presented to : Patient  Discharge Placement              Patient chooses bed at:  [facility]) Patient to be transferred to facility by: [place] Name of family member notified: son [name] in room Patient and family notified of of transfer: 04/10/22  Discharge Plan and Services In-house Referral: Clinical Social Work   Post Acute Care Choice: [place]                               Social Determinants of Health ([place]) Interventions     Readmission Risk Interventions     View : No data to display.

## 2022-04-19 ENCOUNTER — Ambulatory Visit (INDEPENDENT_AMBULATORY_CARE_PROVIDER_SITE_OTHER): Payer: Medicare Other | Admitting: Surgical

## 2022-04-19 ENCOUNTER — Telehealth: Payer: Self-pay

## 2022-04-19 ENCOUNTER — Ambulatory Visit (HOSPITAL_COMMUNITY)
Admission: RE | Admit: 2022-04-19 | Discharge: 2022-04-19 | Disposition: A | Discharge status: Home / Self Care | Payer: Medicare Other | Source: Ambulatory Visit | Attending: Surgical | Admitting: Surgical

## 2022-04-19 DIAGNOSIS — I824Y3 Acute embolism and thrombosis of unspecified deep veins of proximal lower extremity, bilateral: Secondary | ICD-10-CM

## 2022-04-19 DIAGNOSIS — S83412A Sprain of medial collateral ligament of left knee, initial encounter: Secondary | ICD-10-CM

## 2022-04-19 DIAGNOSIS — M79661 Pain in right lower leg: Secondary | ICD-10-CM

## 2022-04-19 DIAGNOSIS — M1712 Unilateral primary osteoarthritis, left knee: Secondary | ICD-10-CM

## 2022-04-19 DIAGNOSIS — I82409 Acute embolism and thrombosis of unspecified deep veins of unspecified lower extremity: Secondary | ICD-10-CM

## 2022-04-19 DIAGNOSIS — M79662 Pain in left lower leg: Secondary | ICD-10-CM | POA: Diagnosis present

## 2022-04-19 HISTORY — DX: Acute embolism and thrombosis of unspecified deep veins of unspecified lower extremity: I82.409

## 2022-04-19 NOTE — Progress Notes (Signed)
Lower extremity venous bilateral study completed.  Preliminary results relayed to April for [place], [place]. Instructions relayed to patient.  See CV Proc for preliminary results report.   Darlin Coco, RDMS, RVT

## 2022-04-19 NOTE — Telephone Encounter (Signed)
Sara Roberson at [facility] wanted to let you know that Patient is Positive for DVT, bilateral leg.    Cb# for patient is [phone], [facility] in [place] is the facility, CB# is [phone].  [facility] on [address] is her pharmacy, but can call facility to verify with her nurse.  Thank you.

## 2022-04-21 ENCOUNTER — Encounter: Payer: Self-pay | Admitting: Surgical

## 2022-04-21 MED ORDER — METHYLPREDNISOLONE ACETATE 40 MG/ML IJ SUSP
40.0000 mg | INTRAMUSCULAR | Status: AC | PRN
  Administered 2022-04-19: 40 mg via INTRA_ARTICULAR

## 2022-04-21 MED ORDER — LIDOCAINE HCL 1 % IJ SOLN
5.0000 mL | INTRAMUSCULAR | Status: AC | PRN
  Administered 2022-04-19: 5 mL

## 2022-04-21 MED ORDER — BUPIVACAINE HCL 0.25 % IJ SOLN
4.0000 mL | INTRAMUSCULAR | Status: AC | PRN
  Administered 2022-04-19: 4 mL via INTRA_ARTICULAR
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
## 2022-04-25 ENCOUNTER — Telehealth: Payer: Self-pay | Admitting: Orthopedic Surgery

## 2022-04-25 NOTE — Telephone Encounter (Signed)
faxed

## 2022-04-25 NOTE — Telephone Encounter (Signed)
Sara Roberson is calling to request doctors notes from [place] office visit on June 2. Please advise!  Fax number is [phone]

## 2022-04-26 ENCOUNTER — Other Ambulatory Visit: Payer: Self-pay | Admitting: Urology

## 2022-05-13 ENCOUNTER — Ambulatory Visit: Admit: 2022-05-13 | Payer: No Typology Code available for payment source | Admitting: Urology

## 2022-05-13 SURGERY — CYSTOSCOPY/URETEROSCOPY/HOLMIUM LASER/STENT PLACEMENT
Anesthesia: General | Laterality: Right

## 2022-05-20 ENCOUNTER — Ambulatory Visit (INDEPENDENT_AMBULATORY_CARE_PROVIDER_SITE_OTHER): Payer: Medicare Other | Admitting: Orthopedic Surgery

## 2022-05-20 DIAGNOSIS — M25511 Pain in right shoulder: Secondary | ICD-10-CM

## 2022-05-20 DIAGNOSIS — M25572 Pain in left ankle and joints of left foot: Secondary | ICD-10-CM

## 2022-05-20 MED ORDER — DIAZEPAM 10 MG PO TABS: ORAL_TABLET | ORAL | 0 refills | Status: AC

## 2022-05-22 ENCOUNTER — Other Ambulatory Visit: Payer: Self-pay | Admitting: Surgical

## 2022-05-22 ENCOUNTER — Telehealth: Payer: Self-pay | Admitting: Orthopedic Surgery

## 2022-05-22 NOTE — Telephone Encounter (Signed)
[facility] was called into [facility] on [address] on 05/20/22 by Dr August Saucer

## 2022-05-22 NOTE — Telephone Encounter (Signed)
Patient called. She would like something called in for her MRI. For her nerves.

## 2022-05-23 ENCOUNTER — Telehealth: Payer: Self-pay | Admitting: Orthopedic Surgery

## 2022-05-23 NOTE — Telephone Encounter (Signed)
Lvm for pt to cb to inform 

## 2022-05-23 NOTE — Telephone Encounter (Signed)
Left second voice mail to return call. Also sent pt. Portal message advising pt.

## 2022-05-23 NOTE — Telephone Encounter (Signed)
Please send something in for the patient to complete the MRI

## 2022-05-24 ENCOUNTER — Other Ambulatory Visit: Payer: Self-pay | Admitting: Urology

## 2022-05-25 ENCOUNTER — Encounter: Payer: Self-pay | Admitting: Orthopedic Surgery
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
## 2022-05-28 ENCOUNTER — Encounter (HOSPITAL_COMMUNITY): Payer: Self-pay | Admitting: Urology

## 2022-05-28 NOTE — Progress Notes (Addendum)
For Short Stay: COVID SWAB appointment date: N/A Date of COVID positive in last 24 days:N/A  Bowel Prep reminder:N/A   For Anesthesia: PCP - Luna Fuse MD note from 05/09/22 in chart Cardiologist - N/A  Chest x-ray - 04/03/22 in epic EKG - none in past 2 years Stress Test - N/A ECHO - N/A Cardiac Cath - N/A Pacemaker/ICD device last checked: N/A Pacemaker orders received: N/A Device Rep notified: N/A  Spinal Cord Stimulator: N/A  Sleep Study -  N/A CPAP -  N/A  Fasting Blood Sugar - N/A Checks Blood Sugar __N/A___ times a day Date and result of last Hgb A1c-N/A  Blood Thinner Instructions: XARELTO REMAIN ON FOR SURGERY Aspirin Instructions: N/A Last Dose: N/A  Activity level: activities of daily living without stopping and without chest pain and/or shortness of breath     Anesthesia review: N/A  Patient denies shortness of breath, fever, cough and chest pain at PAT appointment   Patient verbalized understanding of instructions that were given to them at the PAT appointment. Patient was also instructed that they will need to review over the PAT instructions again at home before surgery.

## 2022-05-28 NOTE — Patient Instructions (Addendum)
Preop instructions for: Sara Roberson     Date of Birth: [birth year]                       Date of Procedure:   05/31/2022 Procedure:  CYSTOSCOPY RIGHT URETEROSCOPY/HOLMIUM LASER/STENT PLACEMENT    Surgeon: Rutherford Nail MD Facility contact: [facility]    Phone:   [phone]              Health Care POA:  RN contact name/phone#:                          and Fax #: [phone]   Transportation contact phone#: [facility]    Time to arrive at [facility]: 10:15 AM   Report to: Admitting (On your left hand side)    Do not eat solid food after midnight before your procedure.(To include any tube feedings-must be discontinued)  May have liquids until 1015 AM day of surgery  CLEAR LIQUID DIET  Foods Allowed                                                                     Exclude  Water, Black Coffee, and tea, regular and decaf             ALL SOLID FOODS! Plain Jell-O in any flavor  (No red)                                     Fruit ices (not with fruit pulp)                                            MILK, SOUPS, Iced Popsicles (No red)                                                                            Apple juices                                                                         ORANGE JUICE Sports drinks like Gatorade (No red)                                  Lightly seasoned clear broth or consume(fat free) Sugar/Sweetner    Take these morning medications only with sips of water.(or give through gastrostomy or feeding tube).    DO NOT STOP TAKING  XARELTO REMAIN ON NORMAL ROUTINE FOR TAKING XARELTO !!!     Please send day of procedure:current med list and meds last taken that day, confirm nothing by mouth status from what time, Patient Demographic info( to include DNR status, problem list, allergies)   Bring Insurance card and picture ID Leave all jewelry and other valuables at place where living( no  metal or rings to be worn) No contact lens Women-no make-up, no lotions,perfumes,powders    Any questions day of procedure,call  SHORT [phone]     Sent from :[facility]                   Phone:[phone]                   [phone]   Sent by :    Rockwell Alexandria BSN,  RN

## 2022-05-29 ENCOUNTER — Telehealth: Payer: Self-pay | Admitting: Orthopedic Surgery

## 2022-05-29 NOTE — Telephone Encounter (Signed)
Hi Sara Roberson can sounds like she is getting an MRI arthrogram on her shoulder on Friday 713.  She should definitely still have blood thinners prescribed as her primary care provider is managing her DVT.  I think the question here is can she come off the blood thinners to have the MRI arthrogram and I would say that is really up to the primary care provider.  If that is a bridge to 4 then I would go ahead and do the MRI of the shoulder without the arthrogram component thank

## 2022-05-29 NOTE — Telephone Encounter (Signed)
Patient called advised her PCP and the hospital would not prescribe blood thinners for the patient because she was discharged and no longer in their care. Patient said she is having surgery Friday 05/30/2022.  Patient asked if Dr August Saucer or Franky Macho would prescribe the blood thinners for her? The number to contact patient is [phone]
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
## 2022-05-30 ENCOUNTER — Telehealth: Payer: Self-pay | Admitting: Surgical

## 2022-05-30 ENCOUNTER — Telehealth: Payer: Self-pay | Admitting: Orthopedic Surgery

## 2022-05-30 ENCOUNTER — Other Ambulatory Visit: Payer: Self-pay | Admitting: Surgical

## 2022-05-30 MED ORDER — RIVAROXABAN 20 MG PO TABS: 20.0000 mg | ORAL_TABLET | Freq: Every morning | ORAL | 0 refills | Status: DC

## 2022-05-30 NOTE — Telephone Encounter (Signed)
Patient called back advised she is having surgery tomorrow to remove a kidney stone. Patient said she took her last blood thinner yesterday. Patient said she is not having the MRI  until 06/17/2022.   Patient said she is asking if Dr. August Saucer or  will prescribe more blood thinners for her until she get another PCP.  The number to contact patient is [phone]

## 2022-05-30 NOTE — Telephone Encounter (Signed)
Patient called advised the Xarelto is $500.00 per the pharmacy. Patient asked if there is a medication that is less expensive that she can get prescribed for her?  [facility] [address].The number to contact patient is [phone]

## 2022-05-30 NOTE — Telephone Encounter (Signed)
Best option would be to call the pharmaceutical company for Xarelto and see if they can give her a discount.  They usually work with patients pretty well.  The number is [phone] and they take calls Monday thruogh Friday from 8 AM to 8 PM

## 2022-05-30 NOTE — Telephone Encounter (Signed)
I sent in a refill for her Xarelto. She should call her surgeon's office and see if they want her to hold her xarelto with her procedure tomorrow before she takes another dose.  Does she need a referral for a PCP? She should have someone to manage and monitor these DVTs

## 2022-05-31 ENCOUNTER — Encounter (HOSPITAL_COMMUNITY)
Admission: RE | Disposition: A | Discharge status: Home / Self Care | Payer: Self-pay | Source: Home / Self Care | Attending: Urology

## 2022-05-31 ENCOUNTER — Ambulatory Visit (HOSPITAL_COMMUNITY): Payer: Medicare Other | Admitting: Physician Assistant

## 2022-05-31 ENCOUNTER — Ambulatory Visit (HOSPITAL_BASED_OUTPATIENT_CLINIC_OR_DEPARTMENT_OTHER): Payer: Medicare Other | Admitting: Physician Assistant

## 2022-05-31 ENCOUNTER — Encounter (HOSPITAL_COMMUNITY): Payer: Self-pay | Admitting: Urology

## 2022-05-31 ENCOUNTER — Ambulatory Visit (HOSPITAL_COMMUNITY)
Admission: RE | Admit: 2022-05-31 | Discharge: 2022-05-31 | Disposition: A | Discharge status: Home / Self Care | Payer: Medicare Other | Attending: Urology | Admitting: Urology

## 2022-05-31 ENCOUNTER — Ambulatory Visit (HOSPITAL_COMMUNITY): Payer: Medicare Other

## 2022-05-31 DIAGNOSIS — N201 Calculus of ureter: Secondary | ICD-10-CM

## 2022-05-31 DIAGNOSIS — Z86718 Personal history of other venous thrombosis and embolism: Secondary | ICD-10-CM | POA: Insufficient documentation

## 2022-05-31 DIAGNOSIS — N132 Hydronephrosis with renal and ureteral calculous obstruction: Secondary | ICD-10-CM | POA: Diagnosis present

## 2022-05-31 DIAGNOSIS — Z7901 Long term (current) use of anticoagulants: Secondary | ICD-10-CM | POA: Diagnosis not present

## 2022-05-31 DIAGNOSIS — D649 Anemia, unspecified: Secondary | ICD-10-CM

## 2022-05-31 HISTORY — DX: Endometriosis, unspecified: N80.9

## 2022-05-31 HISTORY — PX: CYSTOSCOPY/URETEROSCOPY/HOLMIUM LASER/STENT PLACEMENT: SHX6546

## 2022-05-31 HISTORY — DX: Anemia, unspecified: D64.9

## 2022-05-31 LAB — CBC
HCT: 39.7 % (ref 36.0–46.0)
Hemoglobin: 12.4 g/dL (ref 12.0–15.0)
MCH: 30.4 pg (ref 26.0–34.0)
MCHC: 31.2 g/dL (ref 30.0–36.0)
MCV: 97.3 fL (ref 80.0–100.0)
Platelets: 234 10*3/uL (ref 150–400)
RBC: 4.08 MIL/uL (ref 3.87–5.11)
RDW: 13.7 % (ref 11.5–15.5)
WBC: 4.8 10*3/uL (ref 4.0–10.5)
nRBC: 0 % (ref 0.0–0.2)

## 2022-05-31 LAB — BASIC METABOLIC PANEL
Anion gap: 8 (ref 5–15)
BUN: 16 mg/dL (ref 8–23)
CO2: 22 mmol/L (ref 22–32)
Calcium: 9.4 mg/dL (ref 8.9–10.3)
Chloride: 107 mmol/L (ref 98–111)
Creatinine, Ser: 0.65 mg/dL (ref 0.44–1.00)
GFR, Estimated: >60 mL/min (ref >60)
Glucose, Bld: 89 mg/dL (ref 70–99)
Potassium: 4.2 mmol/L (ref 3.5–5.1)
Sodium: 137 mmol/L (ref 135–145)

## 2022-05-31 SURGERY — CYSTOSCOPY/URETEROSCOPY/HOLMIUM LASER/STENT PLACEMENT
Anesthesia: General | Laterality: Right

## 2022-05-31 MED ORDER — PROPOFOL 10 MG/ML IV BOLUS
INTRAVENOUS | Status: DC | PRN
  Administered 2022-05-31: 50 mg via INTRAVENOUS
  Administered 2022-05-31: 30 mg via INTRAVENOUS
  Administered 2022-05-31: 150 mg via INTRAVENOUS

## 2022-05-31 MED ORDER — CHLORHEXIDINE GLUCONATE 0.12 % MT SOLN
15.0000 mL | Freq: Once | OROMUCOSAL | Status: AC
  Administered 2022-05-31: 15 mL via OROMUCOSAL

## 2022-05-31 MED ORDER — DEXAMETHASONE SODIUM PHOSPHATE 10 MG/ML IJ SOLN
INTRAMUSCULAR | Status: AC
Start: 2022-05-31 — End: ?
  Filled 2022-05-31: qty 3

## 2022-05-31 MED ORDER — PROPOFOL 10 MG/ML IV BOLUS
INTRAVENOUS | Status: AC
Start: 2022-05-31 — End: ?
  Filled 2022-05-31: qty 20

## 2022-05-31 MED ORDER — OXYCODONE HCL 5 MG PO CAPS
5.0000 mg | ORAL_CAPSULE | ORAL | 0 refills | Status: DC | PRN
Start: 2022-05-31 — End: 2022-06-23

## 2022-05-31 MED ORDER — CEFAZOLIN SODIUM-DEXTROSE 2-4 GM/100ML-% IV SOLN
2.0000 g | INTRAVENOUS | Status: AC
  Administered 2022-05-31: 2 g via INTRAVENOUS

## 2022-05-31 MED ORDER — PROPOFOL 10 MG/ML IV BOLUS
INTRAVENOUS | Status: AC
  Filled 2022-05-31: qty 20

## 2022-05-31 MED ORDER — LIDOCAINE 2% (20 MG/ML) 5 ML SYRINGE
INTRAMUSCULAR | Status: DC | PRN
  Administered 2022-05-31: 100 mg via INTRAVENOUS

## 2022-05-31 MED ORDER — PHENYLEPHRINE 80 MCG/ML (10ML) SYRINGE FOR IV PUSH (FOR BLOOD PRESSURE SUPPORT)
PREFILLED_SYRINGE | INTRAVENOUS | Status: AC
  Filled 2022-05-31: qty 50

## 2022-05-31 MED ORDER — EPHEDRINE 5 MG/ML INJ
INTRAVENOUS | Status: AC
  Filled 2022-05-31: qty 15

## 2022-05-31 MED ORDER — ONDANSETRON HCL 4 MG/2ML IJ SOLN
INTRAMUSCULAR | Status: AC
  Filled 2022-05-31: qty 6

## 2022-05-31 MED ORDER — FENTANYL CITRATE (PF) 100 MCG/2ML IJ SOLN
INTRAMUSCULAR | Status: AC
  Filled 2022-05-31: qty 2

## 2022-05-31 MED ORDER — PHENYLEPHRINE 80 MCG/ML (10ML) SYRINGE FOR IV PUSH (FOR BLOOD PRESSURE SUPPORT)
PREFILLED_SYRINGE | INTRAVENOUS | Status: AC
Start: 2022-05-31 — End: ?
  Filled 2022-05-31: qty 10

## 2022-05-31 MED ORDER — 0.9 % SODIUM CHLORIDE (POUR BTL) OPTIME
TOPICAL | Status: DC | PRN
  Administered 2022-05-31: 1000 mL

## 2022-05-31 MED ORDER — CEFAZOLIN SODIUM-DEXTROSE 2-4 GM/100ML-% IV SOLN
INTRAVENOUS | Status: AC
  Filled 2022-05-31: qty 100

## 2022-05-31 MED ORDER — FENTANYL CITRATE PF 50 MCG/ML IJ SOSY: 25.0000 ug | PREFILLED_SYRINGE | INTRAMUSCULAR | Status: DC | PRN

## 2022-05-31 MED ORDER — IOHEXOL 300 MG/ML  SOLN
INTRAMUSCULAR | Status: DC | PRN
  Administered 2022-05-31: 8 mL

## 2022-05-31 MED ORDER — PHENYLEPHRINE 80 MCG/ML (10ML) SYRINGE FOR IV PUSH (FOR BLOOD PRESSURE SUPPORT)
PREFILLED_SYRINGE | INTRAVENOUS | Status: DC | PRN
  Administered 2022-05-31 (×4): 160 ug via INTRAVENOUS

## 2022-05-31 MED ORDER — SODIUM CHLORIDE 0.9 % IR SOLN
Status: DC | PRN
  Administered 2022-05-31: 3000 mL

## 2022-05-31 MED ORDER — ACETAMINOPHEN 500 MG PO TABS
1000.0000 mg | ORAL_TABLET | Freq: Once | ORAL | Status: AC
  Administered 2022-05-31: 1000 mg via ORAL
  Filled 2022-05-31: qty 2

## 2022-05-31 MED ORDER — DEXAMETHASONE SODIUM PHOSPHATE 10 MG/ML IJ SOLN
INTRAMUSCULAR | Status: DC | PRN
  Administered 2022-05-31: 10 mg via INTRAVENOUS

## 2022-05-31 MED ORDER — LIDOCAINE HCL (PF) 2 % IJ SOLN
INTRAMUSCULAR | Status: AC
  Filled 2022-05-31: qty 15

## 2022-05-31 MED ORDER — ONDANSETRON HCL 4 MG/2ML IJ SOLN
INTRAMUSCULAR | Status: DC | PRN
  Administered 2022-05-31: 4 mg via INTRAVENOUS

## 2022-05-31 MED ORDER — FENTANYL CITRATE (PF) 100 MCG/2ML IJ SOLN
INTRAMUSCULAR | Status: DC | PRN
  Administered 2022-05-31 (×2): 25 ug via INTRAVENOUS
  Administered 2022-05-31: 50 ug via INTRAVENOUS

## 2022-05-31 MED ORDER — ORAL CARE MOUTH RINSE: 15.0000 mL | Freq: Once | OROMUCOSAL | Status: AC

## 2022-05-31 MED ORDER — LACTATED RINGERS IV SOLN
INTRAVENOUS | Status: DC
Start: 2022-05-31 — End: 2022-05-31

## 2022-05-31 SURGICAL SUPPLY — 24 items
BAG URO CATCHER STRL LF (MISCELLANEOUS) ×3 IMPLANT
BASKET LASER NITINOL 1.9FR (BASKET) IMPLANT
BASKET ZERO TIP NITINOL 2.4FR (BASKET) ×1 IMPLANT
BSKT STON RTRVL 120 1.9FR (BASKET)
BSKT STON RTRVL ZERO TP 2.4FR (BASKET) ×1
CATH URETL OPEN END 6FR 70 (CATHETERS) ×3 IMPLANT
CLOTH BEACON ORANGE TIMEOUT ST (SAFETY) ×3 IMPLANT
EXTRACTOR STONE 1.7FRX115CM (UROLOGICAL SUPPLIES) IMPLANT
GLOVE BIO SURGEON STRL SZ7.5 (GLOVE) ×3 IMPLANT
GOWN STRL REUS W/ TWL XL LVL3 (GOWN DISPOSABLE) ×2 IMPLANT
GOWN STRL REUS W/TWL XL LVL3 (GOWN DISPOSABLE) ×2
GUIDEWIRE ANG ZIPWIRE 038X150 (WIRE) IMPLANT
GUIDEWIRE STR DUAL SENSOR (WIRE) ×3 IMPLANT
KIT TURNOVER KIT A (KITS) IMPLANT
LASER FIB FLEXIVA PULSE ID 365 (Laser) IMPLANT
MANIFOLD NEPTUNE II (INSTRUMENTS) ×3 IMPLANT
PACK CYSTO (CUSTOM PROCEDURE TRAY) ×3 IMPLANT
SHEATH NAVIGATOR HD 11/13X28 (SHEATH) IMPLANT
SHEATH NAVIGATOR HD 11/13X36 (SHEATH) IMPLANT
STENT URET 6FRX26 CONTOUR (STENTS) ×1 IMPLANT
TRACTIP FLEXIVA PULS ID 200XHI (Laser) IMPLANT
TRACTIP FLEXIVA PULSE ID 200 (Laser) ×2
TUBING CONNECTING 10 (TUBING) ×3 IMPLANT
TUBING UROLOGY SET (TUBING) ×3 IMPLANT

## 2022-05-31 NOTE — Telephone Encounter (Signed)
IC advised.  

## 2022-05-31 NOTE — Transfer of Care (Signed)
Immediate Anesthesia Transfer of Care Note  Patient: Sara Roberson  Procedure(s) Performed: CYSTOSCOPY/RETROGRADE/ RIGHT URETEROSCOPY/HOLMIUM LASER/STENT PLACEMENT (Right)  Patient Location: PACU  Anesthesia Type:General  Level of Consciousness: awake, alert  and oriented  Airway & Oxygen Therapy: Patient Spontanous Breathing and Patient connected to face mask oxygen  Post-op Assessment: Report given to RN and Post -op Vital signs reviewed and stable  Post vital signs: Reviewed and stable  Last Vitals:  Vitals Value Taken Time  BP 146/77 05/31/22 1527  Temp 36.4 C 05/31/22 1527  Pulse 83 05/31/22 1530  Resp 17 05/31/22 1530  SpO2 100 % 05/31/22 1530  Vitals shown include unvalidated device data.  Last Pain:  Vitals:   05/31/22 1143  TempSrc: Oral         Complications: No notable events documented.

## 2022-05-31 NOTE — Anesthesia Procedure Notes (Signed)
Procedure Name: LMA Insertion Date/Time: 05/31/2022 2:30 PM  Performed by: Florene Route, CRNAPatient Re-evaluated:Patient Re-evaluated prior to induction Oxygen Delivery Method: Circle system utilized Preoxygenation: Pre-oxygenation with 100% oxygen Induction Type: IV induction LMA: LMA inserted LMA Size: 4.0 Number of attempts: 1 Placement Confirmation: positive ETCO2 and breath sounds checked- equal and bilateral Tube secured with: Tape Dental Injury: Teeth and Oropharynx as per pre-operative assessment
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
## 2022-05-31 NOTE — OR Nursing (Signed)
Stone taken by Dr. Bell. 

## 2022-05-31 NOTE — Discharge Instructions (Signed)

## 2022-06-01 ENCOUNTER — Other Ambulatory Visit: Payer: Medicare Other

## 2022-06-02 ENCOUNTER — Encounter (HOSPITAL_COMMUNITY): Payer: Self-pay | Admitting: Urology

## 2022-06-03 NOTE — Anesthesia Postprocedure Evaluation (Signed)
Anesthesia Post Note  Patient: Sara Roberson  Procedure(s) Performed: CYSTOSCOPY/RETROGRADE/ RIGHT URETEROSCOPY/HOLMIUM LASER/STENT PLACEMENT (Right)     Patient location during evaluation: PACU Anesthesia Type: General Level of consciousness: sedated and patient cooperative Pain management: pain level controlled Vital Signs Assessment: post-procedure vital signs reviewed and stable Respiratory status: spontaneous breathing Cardiovascular status: stable Anesthetic complications: no   No notable events documented.  Last Vitals:  Vitals:   05/31/22 1600 05/31/22 1615  BP: (!) 155/89 (!) 159/96  Pulse: 80 78  Resp: 15 13  Temp: 36.7 C   SpO2: 98% 94%    Last Pain:  Vitals:   05/31/22 1615  TempSrc:   PainSc: 0-No pain                 Lewie Loron

## 2022-06-10 ENCOUNTER — Ambulatory Visit (INDEPENDENT_AMBULATORY_CARE_PROVIDER_SITE_OTHER): Payer: Medicare Other | Admitting: Orthopedic Surgery

## 2022-06-10 DIAGNOSIS — I824Y3 Acute embolism and thrombosis of unspecified deep veins of proximal lower extremity, bilateral: Secondary | ICD-10-CM

## 2022-06-10 MED ORDER — RIVAROXABAN 20 MG PO TABS: ORAL_TABLET | ORAL | 0 refills | Status: DC

## 2022-06-11 ENCOUNTER — Telehealth: Payer: Self-pay | Admitting: Hematology and Oncology

## 2022-06-11 NOTE — Telephone Encounter (Signed)
Scheduled appt per 7/24 referral. Pt is aware of appt date and time. Pt is aware to arrive 15 mins prior to appt time and to bring and updated insurance card. Pt is aware of appt location.   

## 2022-06-16 ENCOUNTER — Encounter: Payer: Self-pay | Admitting: Orthopedic Surgery
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
## 2022-06-17 ENCOUNTER — Ambulatory Visit
Admission: RE | Admit: 2022-06-17 | Discharge: 2022-06-17 | Disposition: A | Discharge status: Home / Self Care | Payer: Medicare Other | Source: Ambulatory Visit | Attending: Orthopedic Surgery | Admitting: Orthopedic Surgery

## 2022-06-17 DIAGNOSIS — M25511 Pain in right shoulder: Secondary | ICD-10-CM

## 2022-06-17 DIAGNOSIS — M25572 Pain in left ankle and joints of left foot: Secondary | ICD-10-CM

## 2022-06-21 ENCOUNTER — Telehealth: Payer: Self-pay | Admitting: Orthopedic Surgery

## 2022-06-21 ENCOUNTER — Ambulatory Visit (INDEPENDENT_AMBULATORY_CARE_PROVIDER_SITE_OTHER): Payer: Medicare Other | Admitting: Surgical

## 2022-06-21 DIAGNOSIS — M75101 Unspecified rotator cuff tear or rupture of right shoulder, not specified as traumatic: Secondary | ICD-10-CM | POA: Diagnosis not present
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
## 2022-06-23 ENCOUNTER — Encounter: Payer: Self-pay | Admitting: Surgical

## 2022-06-23 MED ORDER — TRAMADOL HCL 50 MG PO TABS: 50.0000 mg | ORAL_TABLET | Freq: Every day | ORAL | 0 refills | Status: DC | PRN
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
## 2022-06-23 NOTE — Addendum Note (Signed)
Addended by: Julieanne Cotton on: 06/23/2022 02:58 PM   Modules accepted: Orders

## 2022-06-24 ENCOUNTER — Telehealth: Payer: Self-pay | Admitting: Surgical

## 2022-06-24 NOTE — Telephone Encounter (Signed)
Tried calling to advise per Leane Call note. No answer. LMVM

## 2022-06-24 NOTE — Telephone Encounter (Signed)
Patient called advised the Rx for pain is not showing called into the pharmacy yet. Patient said she uses [facility] on [address]. The number to contact patient is [phone]

## 2022-06-24 NOTE — Telephone Encounter (Signed)
It was submitted to pharmacy yesterday

## 2022-06-28 ENCOUNTER — Telehealth: Payer: Self-pay | Admitting: Orthopedic Surgery

## 2022-06-28 NOTE — Telephone Encounter (Signed)
Putting order into [facility].

## 2022-06-28 NOTE — Telephone Encounter (Signed)
pending

## 2022-06-28 NOTE — Telephone Encounter (Signed)
Patient request a Rx for a stand up walker

## 2022-07-03 NOTE — Telephone Encounter (Signed)
Tried calling patient to discuss. No answer. We did provide patient with MRI report.  LM for her to call back.
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
## 2022-07-04 ENCOUNTER — Other Ambulatory Visit: Payer: Self-pay

## 2022-07-04 ENCOUNTER — Inpatient Hospital Stay: Payer: Medicare Other

## 2022-07-04 ENCOUNTER — Inpatient Hospital Stay: Payer: Medicare Other | Attending: Hematology and Oncology | Admitting: Hematology and Oncology

## 2022-07-04 DIAGNOSIS — I82412 Acute embolism and thrombosis of left femoral vein: Secondary | ICD-10-CM | POA: Diagnosis not present

## 2022-07-04 DIAGNOSIS — I82413 Acute embolism and thrombosis of femoral vein, bilateral: Secondary | ICD-10-CM | POA: Diagnosis not present

## 2022-07-04 DIAGNOSIS — I82441 Acute embolism and thrombosis of right tibial vein: Secondary | ICD-10-CM | POA: Insufficient documentation

## 2022-07-04 DIAGNOSIS — Z7901 Long term (current) use of anticoagulants: Secondary | ICD-10-CM | POA: Insufficient documentation

## 2022-07-04 MED ORDER — BETAMETHASONE DIPROPIONATE 0.05 % EX CREA
TOPICAL_CREAM | Freq: Two times a day (BID) | CUTANEOUS | 2 refills | Status: AC
Start: 2022-07-04 — End: ?
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
## 2022-07-05 ENCOUNTER — Telehealth: Payer: Self-pay | Admitting: Hematology and Oncology

## 2022-07-05 LAB — CARDIOLIPIN ANTIBODIES, IGG, IGM, IGA
Anticardiolipin IgA: <9 APL U/mL (ref 0–11)
Anticardiolipin IgG: <9 GPL U/mL (ref 0–14)
Anticardiolipin IgM: <9 MPL U/mL (ref 0–12)

## 2022-07-05 NOTE — Telephone Encounter (Signed)
Scheduled appointment per 8/17 los. Left voicemail. 

## 2022-07-06 LAB — BETA-2-GLYCOPROTEIN I ABS, IGG/M/A
Beta-2 Glyco I IgG: <9 GPI IgG units (ref 0–20)
Beta-2-Glycoprotein I IgA: <9 GPI IgA units (ref 0–25)
Beta-2-Glycoprotein I IgM: <9 GPI IgM units (ref 0–32)
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
## 2022-07-10 ENCOUNTER — Inpatient Hospital Stay (HOSPITAL_BASED_OUTPATIENT_CLINIC_OR_DEPARTMENT_OTHER): Payer: Medicare Other | Admitting: Hematology and Oncology

## 2022-07-10 DIAGNOSIS — I82413 Acute embolism and thrombosis of femoral vein, bilateral: Secondary | ICD-10-CM | POA: Diagnosis not present

## 2022-07-10 LAB — LUPUS ANTICOAGULANT PANEL
DRVVT: 170.9 s — ABNORMAL HIGH (ref 0.0–47.0)
PTT Lupus Anticoagulant: 51.5 s — ABNORMAL HIGH (ref 0.0–43.5)

## 2022-07-10 LAB — DRVVT MIX: dRVVT Mix: 105.4 s — ABNORMAL HIGH (ref 0.0–40.4)

## 2022-07-10 LAB — HEXAGONAL PHASE PHOSPHOLIPID: Hexagonal Phase Phospholipid: 16 s — ABNORMAL HIGH (ref 0–11)

## 2022-07-10 LAB — PTT-LA MIX: PTT-LA Mix: 45.5 s — ABNORMAL HIGH (ref 0.0–40.5)

## 2022-07-10 LAB — DRVVT CONFIRM: dRVVT Confirm: 2.8 ratio — ABNORMAL HIGH (ref 0.8–1.2)
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
## 2022-07-11 ENCOUNTER — Telehealth: Payer: Self-pay | Admitting: Hematology and Oncology

## 2022-07-11 ENCOUNTER — Other Ambulatory Visit: Payer: Self-pay | Admitting: *Deleted

## 2022-07-11 DIAGNOSIS — I82413 Acute embolism and thrombosis of femoral vein, bilateral: Secondary | ICD-10-CM

## 2022-07-11 NOTE — Progress Notes (Signed)
Per MD request, RN sent in basket to scheduling team to schedule pt for lab and MD f/u one week later in November of 2023.

## 2022-07-11 NOTE — Telephone Encounter (Signed)
Scheduled appointment per 8/24 staff message. Patient is aware. 

## 2022-07-29 ENCOUNTER — Other Ambulatory Visit: Payer: Self-pay | Admitting: *Deleted

## 2022-07-29 MED ORDER — RIVAROXABAN 20 MG PO TABS: 20.0000 mg | ORAL_TABLET | Freq: Every morning | ORAL | 2 refills | Status: DC

## 2022-07-29 NOTE — Telephone Encounter (Signed)
Received call from pt requesting refill on Xarelo 20 mg tab.  Pt states prescription is currently costing her $100 a month and requesting information on alternatives.  RN educated pt on Eliquis but with pt insurance, copay will be around the same.  RN also educated pt on Warfarin but pt states she does not want to modify her diet and does not want to come in for frequent lab draws.  Pt states she will continue to pay co pay until f/u with MD to discuss in further detail.

## 2022-08-02 ENCOUNTER — Telehealth: Payer: Self-pay

## 2022-08-02 NOTE — Telephone Encounter (Signed)
Per previous RN discussion with Warden Fillers, paperwork was provided for patient assistance program application through [employer]. Called pt to advise she should pick up this paperwork from [facility] and fill out her portion as there are financial questions she will need to complete. Once completed she can return it to the cancer center and our medical office will do out part for her and send to [employer]. Pt verbalized understanding and states she will pick up paperwork from [facility] Monday, 9/18.

## 2022-08-02 NOTE — Telephone Encounter (Signed)
-----   Message from Lorenza Chick sent at 08/02/2022 11:39 AM EDT ----- Regarding: RE: Drug CoPay Assistance Xarelto Hey there!  I can research what companies have assistance and bring you the application.  It has to be completed by the pt and the physician since its an oral the prescription has be completed and signed by the dr.  ----- Message ----- From: Mauri Pole, RN Sent: 07/29/2022   1:46 PM EDT To: Alexander Mt, RN; Dellis Filbert, LPN; # Subject: Drug CoPay Assistance Xarelto                  Hey Warden Fillers.  Do you offer any support for dug assistance or applications for assistance for Xarelto?  This sweet lady called and said that she is paying $100 a month and that is challenging on a fixed income.    Sara Roberson

## 2022-08-07 ENCOUNTER — Telehealth: Payer: Self-pay

## 2022-08-07 NOTE — Telephone Encounter (Signed)
Attempted to call pt to have her come pick up ppw provided for a patient assistance program. Pt did not answer and phone rang continuously without VM option.

## 2022-08-09 ENCOUNTER — Telehealth: Payer: Self-pay | Admitting: *Deleted

## 2022-08-09 NOTE — Telephone Encounter (Signed)
Pt called stating that she did not have transportation to pick up paperwork and will come on Monday 9/25.

## 2022-08-12 ENCOUNTER — Telehealth: Payer: Self-pay | Admitting: Orthopedic Surgery

## 2022-08-12 NOTE — Telephone Encounter (Signed)
Pt wants to know if Dr Marlou Sa will write her a script for a new walker that she got from Rehab in July because it is to small. [phone]

## 2022-08-13 ENCOUNTER — Telehealth: Payer: Self-pay

## 2022-08-13 NOTE — Telephone Encounter (Signed)
Called pt to make her aware [employer] no longer offers assistance to pts who have health insurance.

## 2022-08-13 NOTE — Telephone Encounter (Signed)
-----   Message from Joellyn Haff sent at 08/13/2022  1:07 PM EDT ----- Regarding: RE: Drug CoPay Assistance Xarelto Just looked on their website and it does state:Beginning November 18, 2021, patients that have public or private insurance will no longer be eligible for the JJPAF program. So I guess there's no assistance for Xarelto at this time.  ----- Message ----- From: Lynnae Sandhoff, LPN Sent: 11/27/3157  12:53 PM EDT To: Thurnell Garbe, RN; Merril Abbe, LPN; # Subject: RE: Drug CoPay Assistance Xarelto              Arn Medal,  Ms Harral finally came yesterday to pick up the [place] paperwork you brought to [place] for her. She called today and is concerned because the paperwork states its for pts who are uninsured. Ms Whitmill has insurance. Does this exempt her from the program?  Mickel Baas, LPN ----- Message ----- From: Annamary Rummage A Sent: 08/02/2022  11:40 AM EDT To: Thurnell Garbe, RN; Merril Abbe, LPN; # Subject: RE: Drug CoPay Assistance Xarelto              Hey there!  I can research what companies have assistance and bring you the application.  It has to be completed by the pt and the physician since its an oral the prescription has be completed and signed by the dr.  ----- Message ----- From: Myrtie Hawk, RN Sent: 07/29/2022   1:46 PM EDT To: Thurnell Garbe, RN; Merril Abbe, LPN; # Subject: Drug CoPay Assistance Xarelto                  Hey [address]  Do you offer any support for dug assistance or applications for assistance for Xarelto?  This sweet lady called and said that she is paying $100 a month and that is challenging on a fixed income.    Merleen Nicely

## 2022-08-13 NOTE — Telephone Encounter (Signed)
Pt came to pick up [place] paperwork provided by Annamary Rummage for financial assistance regarding Xarelto. Pt called today and states the paperwork indicates this is for patients who are uninsured, so she is concerned she is not a candidate. Advised pt I would send a message to [facility] for clarification and we would be in touch. She verbalized thanks and understanding.

## 2022-08-21 NOTE — Telephone Encounter (Signed)
I can write RX for this, that's fine by me

## 2022-10-07 ENCOUNTER — Inpatient Hospital Stay: Payer: Medicare Other

## 2022-10-11 ENCOUNTER — Other Ambulatory Visit: Payer: Self-pay | Admitting: *Deleted

## 2022-10-14 ENCOUNTER — Ambulatory Visit: Payer: Self-pay | Admitting: Internal Medicine

## 2022-10-14 ENCOUNTER — Inpatient Hospital Stay: Payer: Medicare Other | Attending: Hematology and Oncology

## 2022-10-14 ENCOUNTER — Other Ambulatory Visit: Payer: Self-pay

## 2022-10-14 DIAGNOSIS — I82413 Acute embolism and thrombosis of femoral vein, bilateral: Secondary | ICD-10-CM | POA: Diagnosis not present

## 2022-10-14 DIAGNOSIS — Z7901 Long term (current) use of anticoagulants: Secondary | ICD-10-CM | POA: Diagnosis not present

## 2022-10-15 NOTE — Progress Notes (Incomplete)
Patient Care Team: Shon Baton, MD as PCP - General (Internal Medicine)  DIAGNOSIS: No diagnosis found.  SUMMARY OF ONCOLOGIC HISTORY: Oncology History   No history exists.    CHIEF COMPLIANT: Follow-up to discuss Xarelto price and labs results  INTERVAL HISTORY: Sara Roberson is a 76 y.o. 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  She reports to the clinic for a follow-up.   ALLERGIES:  is allergic to aspirin.  MEDICATIONS:  Current Outpatient Medications  Medication Sig Dispense Refill   betamethasone dipropionate 0.05 % cream Apply topically 2 (two) times daily. 30 g 2   cetirizine-pseudoephedrine (ZYRTEC-D) 5-120 MG tablet Take 1 tablet by mouth daily as needed for allergies or rhinitis. 15 tablet 0   diazepam (VALIUM) 10 MG tablet 1 po q 1 hour prior to MRI scan for anxiety. 4 tablet 0   diclofenac Sodium (VOLTAREN ARTHRITIS PAIN) 1 % GEL Apply 1 Application topically in the morning, at noon, and at bedtime. Applied to left knee & left ankle     Glucosamine-Chondroitin 750-600 MG TABS Take 1 tablet by mouth in the morning. (0830)     Multiple Vitamin-Folic Acid TABS Take 1 tablet by mouth in the morning. (0730)     rivaroxaban (XARELTO) 20 MG TABS tablet 1 po qd x 60 days 60 tablet 0   rivaroxaban (XARELTO) 20 MG TABS tablet Take 1 tablet (20 mg total) by mouth in the morning. (0730) 30 tablet 2   senna-docusate (SENOKOT S) 8.6-50 MG tablet Take 1 tablet by mouth 2 (two) times daily.     traMADol (ULTRAM) 50 MG tablet Take 1 tablet (50 mg total) by mouth daily as needed. 20 tablet 0   vitamin C (ASCORBIC ACID) 500 MG tablet Take 500 mg by mouth in the morning. (0730)     No current facility-administered medications for this visit.    PHYSICAL EXAMINATION: ECOG PERFORMANCE STATUS: {CHL ONC ECOG PS:[phone]}  There were no vitals filed for this visit. There were no vitals filed for this  visit.  BREAST:*** No palpable masses or nodules in either right or left breasts. No palpable axillary supraclavicular or infraclavicular adenopathy no breast tenderness or nipple discharge. (exam performed in the presence of a chaperone)  LABORATORY DATA:  I have reviewed the data as listed    Latest Ref Rng & Units 05/31/2022   12:10 PM 04/10/2022    3:43 AM 04/09/2022   12:38 AM  CMP  Glucose 70 - 99 mg/dL 89  106  122   BUN 8 - 23 mg/dL 16  16  20    Creatinine 0.44 - 1.00 mg/dL 0.65  0.84  0.94   Sodium 135 - 145 mmol/L 137  136  138   Potassium 3.5 - 5.1 mmol/L 4.2  4.4  4.6   Chloride 98 - 111 mmol/L 107  106  109   CO2 22 - 32 mmol/L 22  26  23    Calcium 8.9 - 10.3 mg/dL 9.4  9.4  9.1   Total Protein 6.5 - 8.1 g/dL  6.3  6.2   Total Bilirubin 0.3 - 1.2 mg/dL  0.6  0.7   Alkaline Phos 38 - 126 U/L  52  51   AST 15 - 41 U/L  24  23   ALT 0 - 44 U/L  30  29     Lab Results  Component Value Date   WBC 4.8  05/31/2022   HGB 12.4 05/31/2022   HCT 39.7 05/31/2022   MCV 97.3 05/31/2022   PLT 234 05/31/2022   NEUTROABS 3.5 04/10/2022    ASSESSMENT & PLAN:  No problem-specific Assessment & Plan notes found for this encounter.    No orders of the defined types were placed in this encounter.  The patient has a good understanding of the overall plan. she agrees with it. she will call with any problems that may develop before the next visit here. Total time spent: 30 mins including face to face time and time spent for planning, charting and co-ordination of care   Sherlyn Lick, CMA 10/15/22    I Janan Ridge am scribing for Dr. Pamelia Hoit  ***

## 2022-10-16 LAB — LUPUS ANTICOAGULANT PANEL
DRVVT: 138.4 s — ABNORMAL HIGH (ref 0.0–47.0)
PTT Lupus Anticoagulant: 47.4 s — ABNORMAL HIGH (ref 0.0–43.5)

## 2022-10-16 LAB — HEXAGONAL PHASE PHOSPHOLIPID: Hexagonal Phase Phospholipid: 11 s (ref 0–11)

## 2022-10-16 LAB — DRVVT CONFIRM: dRVVT Confirm: 2.5 ratio — ABNORMAL HIGH (ref 0.8–1.2)

## 2022-10-16 LAB — DRVVT MIX: dRVVT Mix: 89.5 s — ABNORMAL HIGH (ref 0.0–40.4)

## 2022-10-16 LAB — PTT-LA MIX: PTT-LA Mix: 43 s — ABNORMAL HIGH (ref 0.0–40.5)

## 2022-10-17 ENCOUNTER — Inpatient Hospital Stay: Payer: Medicare Other | Admitting: Hematology and Oncology
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
## 2022-10-22 ENCOUNTER — Inpatient Hospital Stay: Payer: Medicare Other | Attending: Hematology and Oncology | Admitting: Hematology and Oncology

## 2022-10-22 ENCOUNTER — Other Ambulatory Visit: Payer: Self-pay

## 2022-10-22 ENCOUNTER — Inpatient Hospital Stay: Payer: Medicare Other

## 2022-10-22 VITALS — BP 149/64 | HR 74 | Temp 97.7°F | Resp 18 | Ht 70.0 in | Wt 279.9 lb

## 2022-10-22 DIAGNOSIS — Z7901 Long term (current) use of anticoagulants: Secondary | ICD-10-CM | POA: Diagnosis not present

## 2022-10-22 DIAGNOSIS — I82413 Acute embolism and thrombosis of femoral vein, bilateral: Secondary | ICD-10-CM

## 2022-10-22 DIAGNOSIS — Z86718 Personal history of other venous thrombosis and embolism: Secondary | ICD-10-CM | POA: Insufficient documentation

## 2022-10-22 LAB — D-DIMER, QUANTITATIVE: D-Dimer, Quant: 0.35 ug/mL-FEU (ref 0.00–0.50)
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
## 2022-10-24 LAB — LUPUS ANTICOAGULANT PANEL
DRVVT: 111.1 s — ABNORMAL HIGH (ref 0.0–47.0)
PTT Lupus Anticoagulant: 42.1 s (ref 0.0–43.5)

## 2022-10-24 LAB — DRVVT CONFIRM: dRVVT Confirm: 2.1 ratio — ABNORMAL HIGH (ref 0.8–1.2)

## 2022-10-24 LAB — DRVVT MIX: dRVVT Mix: 71.6 s — ABNORMAL HIGH (ref 0.0–40.4)

## 2022-10-25 ENCOUNTER — Encounter (HOSPITAL_COMMUNITY): Payer: Medicare Other

## 2022-10-28 ENCOUNTER — Ambulatory Visit (HOSPITAL_COMMUNITY)
Admission: RE | Admit: 2022-10-28 | Discharge: 2022-10-28 | Disposition: A | Discharge status: Home / Self Care | Payer: Medicare Other | Source: Ambulatory Visit | Attending: Hematology and Oncology | Admitting: Hematology and Oncology

## 2022-10-28 DIAGNOSIS — I82413 Acute embolism and thrombosis of femoral vein, bilateral: Secondary | ICD-10-CM | POA: Diagnosis present
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
## 2022-10-29 ENCOUNTER — Inpatient Hospital Stay (HOSPITAL_BASED_OUTPATIENT_CLINIC_OR_DEPARTMENT_OTHER): Payer: Medicare Other | Admitting: Hematology and Oncology

## 2022-10-29 DIAGNOSIS — I82413 Acute embolism and thrombosis of femoral vein, bilateral: Secondary | ICD-10-CM
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
## 2022-11-06 ENCOUNTER — Other Ambulatory Visit: Payer: Self-pay

## 2022-11-06 MED ORDER — RIVAROXABAN 20 MG PO TABS: 20.0000 mg | ORAL_TABLET | Freq: Every morning | ORAL | 3 refills | Status: DC

## 2022-11-06 NOTE — Telephone Encounter (Signed)
Pt called requesting refill for Xarelto. Refilled per MD.

## 2022-11-12 ENCOUNTER — Telehealth: Payer: Self-pay

## 2022-11-12 NOTE — Telephone Encounter (Signed)
Pt called and LVM asking about medication, states in VM she has not heard anything; no specific details. Attempted to call pt X3 and phone went straight to VM. VM box full.

## 2022-12-11 ENCOUNTER — Telehealth: Payer: Self-pay | Admitting: *Deleted

## 2022-12-11 NOTE — Telephone Encounter (Signed)
Received call from pt requesting advice from MD if okay to take Delsym OTC while on Xarelto for a head cold.  Per MD okay to proceed.  Pt educated and verbalized understanding.

## 2023-01-31 ENCOUNTER — Telehealth: Payer: Self-pay | Admitting: *Deleted

## 2023-01-31 NOTE — Telephone Encounter (Signed)
Received call from pt requesting advice from MD if okay to proceed with the 4th Covid Booster.  Per MD okay to proceed as long as it has been 1 year since the previous booster.  Pt educated and verbalized understanding.

## 2023-02-19 ENCOUNTER — Ambulatory Visit: Payer: Medicare Other | Admitting: Internal Medicine

## 2023-03-06 ENCOUNTER — Other Ambulatory Visit: Payer: Self-pay | Admitting: Hematology and Oncology

## 2023-04-18 ENCOUNTER — Telehealth: Payer: Self-pay | Admitting: Orthopedic Surgery

## 2023-04-18 NOTE — Telephone Encounter (Signed)
Received call from [facility] checking status of request for records. I originally faxed records 02/20/23. I refaxed [phone]

## 2023-07-08 ENCOUNTER — Other Ambulatory Visit: Payer: Self-pay | Admitting: Hematology and Oncology

## 2023-07-29 ENCOUNTER — Ambulatory Visit: Payer: Medicare Other | Admitting: Internal Medicine

## 2023-09-10 ENCOUNTER — Telehealth: Payer: Self-pay | Admitting: *Deleted

## 2023-09-10 ENCOUNTER — Telehealth: Payer: Self-pay

## 2023-09-10 NOTE — Telephone Encounter (Signed)
Called Patient to explain the cost of her Xarelto prescription for this time. Left voicemail explaining that Medicare re-enrollment started over and that the "donut hole" for Part D Patients had to be met before co-payment for any medication would start. Pharmacy advised that medication was one hundred and thirty eight dollars this fill. Encouraged Patient to call for any questions and provided telephone number for this Nurse.

## 2023-09-10 NOTE — Telephone Encounter (Signed)
Received call from pt stating her Xarelto will be going from $40 a month to $138.66 per month.  Pharmacy states pt is in an insurance "donut hole" and is not eligible for co-pay assistance due to [employer].  RN requested assistance from [place], [place] for possible pt assistance.

## 2023-09-11 ENCOUNTER — Telehealth: Payer: Self-pay | Admitting: Hematology and Oncology

## 2023-09-11 NOTE — Telephone Encounter (Signed)
Spoke with patient confirming upcoming appointment  

## 2023-10-09 ENCOUNTER — Other Ambulatory Visit: Payer: Self-pay | Admitting: *Deleted

## 2023-10-09 MED ORDER — RIVAROXABAN 20 MG PO TABS: 20.0000 mg | ORAL_TABLET | Freq: Every day | ORAL | 3 refills | Status: DC

## 2023-10-20 ENCOUNTER — Telehealth: Payer: Self-pay

## 2023-10-20 NOTE — Telephone Encounter (Signed)
Pt called and asked if she should change how she takes her Xarelto. She reports she has been taking it in the mornings but new Rx says to take at supper. Advised pt per MD to take at the same time she has been taking it. She verbalized agreement.

## 2023-11-03 ENCOUNTER — Inpatient Hospital Stay: Payer: Medicare Other | Attending: Hematology and Oncology | Admitting: Hematology and Oncology

## 2023-11-03 VITALS — BP 155/71 | HR 87 | Temp 98.6°F | Resp 18 | Ht 70.0 in | Wt 268.8 lb

## 2023-11-03 DIAGNOSIS — I82413 Acute embolism and thrombosis of femoral vein, bilateral: Secondary | ICD-10-CM | POA: Diagnosis not present

## 2023-11-03 DIAGNOSIS — Z86718 Personal history of other venous thrombosis and embolism: Secondary | ICD-10-CM | POA: Insufficient documentation

## 2023-11-03 DIAGNOSIS — Z7901 Long term (current) use of anticoagulants: Secondary | ICD-10-CM | POA: Insufficient documentation

## 2023-11-03 DIAGNOSIS — D6862 Lupus anticoagulant syndrome: Secondary | ICD-10-CM | POA: Diagnosis present

## 2023-11-03 MED ORDER — ELDERBERRY 500 MG PO CAPS: 1.0000 | ORAL_CAPSULE | Freq: Every day | ORAL | Status: AC
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
## 2023-12-04 ENCOUNTER — Telehealth: Payer: Self-pay | Admitting: *Deleted

## 2023-12-04 ENCOUNTER — Other Ambulatory Visit: Payer: Self-pay

## 2023-12-04 ENCOUNTER — Inpatient Hospital Stay: Payer: Medicare Other | Attending: Hematology and Oncology | Admitting: Physician Assistant

## 2023-12-04 ENCOUNTER — Ambulatory Visit (HOSPITAL_COMMUNITY)
Admission: RE | Admit: 2023-12-04 | Discharge: 2023-12-04 | Disposition: A | Discharge status: Home / Self Care | Payer: Medicare Other | Source: Ambulatory Visit | Attending: Physician Assistant | Admitting: Physician Assistant

## 2023-12-04 VITALS — BP 139/74 | HR 72 | Temp 98.3°F | Resp 19 | Wt 262.2 lb

## 2023-12-04 DIAGNOSIS — Z87442 Personal history of urinary calculi: Secondary | ICD-10-CM | POA: Diagnosis not present

## 2023-12-04 DIAGNOSIS — Z79899 Other long term (current) drug therapy: Secondary | ICD-10-CM | POA: Diagnosis not present

## 2023-12-04 DIAGNOSIS — D6862 Lupus anticoagulant syndrome: Secondary | ICD-10-CM | POA: Diagnosis present

## 2023-12-04 DIAGNOSIS — Z86718 Personal history of other venous thrombosis and embolism: Secondary | ICD-10-CM | POA: Diagnosis not present

## 2023-12-04 DIAGNOSIS — I82413 Acute embolism and thrombosis of femoral vein, bilateral: Secondary | ICD-10-CM | POA: Diagnosis present

## 2023-12-04 DIAGNOSIS — M79605 Pain in left leg: Secondary | ICD-10-CM | POA: Insufficient documentation

## 2023-12-04 DIAGNOSIS — M129 Arthropathy, unspecified: Secondary | ICD-10-CM | POA: Insufficient documentation

## 2023-12-04 DIAGNOSIS — Z7901 Long term (current) use of anticoagulants: Secondary | ICD-10-CM | POA: Insufficient documentation

## 2023-12-04 DIAGNOSIS — D649 Anemia, unspecified: Secondary | ICD-10-CM | POA: Diagnosis not present

## 2023-12-04 NOTE — Progress Notes (Signed)
Patient states that her left leg is the symptomatic one not the right as stated on the order.  Left lower extremity venous duplex performed.   [facility], RVT.

## 2023-12-04 NOTE — Telephone Encounter (Signed)
Received call from pt with complaint of redness on left lower leg x several days.  Pt denies recent injury, trauma, or warmth to extremity but does state she is experiencing discomfort.  Per MD pt needing to be seen by [facility] for further evaluation with hx of right leg DVT.  Appt scheduled, pt educated and verbalized understanding.
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
## 2024-01-05 ENCOUNTER — Telehealth: Payer: Self-pay

## 2024-01-05 NOTE — Telephone Encounter (Signed)
 Pt called back and asked what she can take with xarelto for leg cramps since she will have a 2 hour flight. Advised Magnesium oxide and plenty of water. She verbalized thanks and understanding.

## 2024-01-05 NOTE — Telephone Encounter (Signed)
 Pt called and LVM stating she is going to be flying from [place] to [place] and asks if she can take Turmeric, and is also asking for advice regarding her Xarelto LVM for call back.

## 2024-01-12 ENCOUNTER — Ambulatory Visit: Payer: Medicare Other | Admitting: Internal Medicine

## 2024-02-10 ENCOUNTER — Other Ambulatory Visit: Payer: Self-pay | Admitting: *Deleted

## 2024-02-10 MED ORDER — RIVAROXABAN 20 MG PO TABS: 20.0000 mg | ORAL_TABLET | Freq: Every day | ORAL | 3 refills | Status: DC

## 2024-02-24 ENCOUNTER — Ambulatory Visit: Payer: Medicare Other | Admitting: Internal Medicine

## 2024-06-07 ENCOUNTER — Other Ambulatory Visit: Payer: Self-pay

## 2024-06-07 MED ORDER — RIVAROXABAN 20 MG PO TABS: 20.0000 mg | ORAL_TABLET | Freq: Every day | ORAL | 3 refills | Status: DC

## 2024-06-07 NOTE — Telephone Encounter (Signed)
 Pt called to request refill on Xarelto  to [facility] [address] Per MD refill sent pt is aware.

## 2024-06-21 ENCOUNTER — Ambulatory Visit: Admitting: Internal Medicine

## 2024-07-08 ENCOUNTER — Other Ambulatory Visit: Payer: Self-pay | Admitting: *Deleted

## 2024-07-08 MED ORDER — RIVAROXABAN 20 MG PO TABS
20.0000 mg | ORAL_TABLET | Freq: Every day | ORAL | 3 refills | Status: AC
Start: 2024-07-08 — End: ?

## 2024-07-08 MED ORDER — RIVAROXABAN 20 MG PO TABS: 20.0000 mg | ORAL_TABLET | Freq: Every day | ORAL | 3 refills | Status: DC

## 2024-09-21 ENCOUNTER — Telehealth: Payer: Self-pay | Admitting: *Deleted

## 2024-09-21 ENCOUNTER — Ambulatory Visit: Admitting: Internal Medicine

## 2024-09-21 NOTE — Telephone Encounter (Signed)
 Received call from pt requesting advice from MD if okay to use Voltaren Gel for wrist pain while on Xarelto .  Per MD, okay to proceed.  Pt educated and verbalized understanding.

## 2024-09-30 ENCOUNTER — Telehealth: Payer: Self-pay | Admitting: *Deleted

## 2024-09-30 NOTE — Telephone Encounter (Signed)
 Received call from pt with complaint of dry skin requesting advice on OTC therapies.  RN educated pt on Eucerin lotion, Gold Bond dry skin, as well as Aquaphor.  Pt verbalized understanding.

## 2024-11-03 ENCOUNTER — Inpatient Hospital Stay: Payer: Medicare Other | Attending: Hematology and Oncology | Admitting: Hematology and Oncology

## 2024-11-03 VITALS — BP 158/97 | HR 81 | Temp 97.7°F | Resp 18 | Ht 70.0 in | Wt 262.2 lb

## 2024-11-03 DIAGNOSIS — I82413 Acute embolism and thrombosis of femoral vein, bilateral: Secondary | ICD-10-CM | POA: Diagnosis not present

## 2024-11-03 DIAGNOSIS — Z86718 Personal history of other venous thrombosis and embolism: Secondary | ICD-10-CM | POA: Insufficient documentation

## 2024-11-03 DIAGNOSIS — D6862 Lupus anticoagulant syndrome: Secondary | ICD-10-CM | POA: Insufficient documentation

## 2024-11-03 DIAGNOSIS — Z7901 Long term (current) use of anticoagulants: Secondary | ICD-10-CM | POA: Insufficient documentation

## 2024-11-03 MED ORDER — RIVAROXABAN 20 MG PO TABS: 20.0000 mg | ORAL_TABLET | Freq: Every day | ORAL | 3 refills | Status: DC
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
## 2024-11-05 ENCOUNTER — Telehealth: Payer: Self-pay

## 2024-11-05 NOTE — Telephone Encounter (Signed)
 Pt called wanting to know what her lab work would entail. RN educated pt that lab work would entail drawing blood for the labs that Dr. Odean ordered and that he would go over any abnormal results with her on her visit with him on 11/25/24. Pt verbalized understanding.

## 2024-11-08 ENCOUNTER — Inpatient Hospital Stay

## 2024-11-08 DIAGNOSIS — Z86718 Personal history of other venous thrombosis and embolism: Secondary | ICD-10-CM | POA: Diagnosis not present

## 2024-11-08 DIAGNOSIS — I82413 Acute embolism and thrombosis of femoral vein, bilateral: Secondary | ICD-10-CM

## 2024-11-09 LAB — LUPUS ANTICOAGULANT PANEL
DRVVT: 77.1 s — ABNORMAL HIGH (ref 0.0–47.0)
PTT Lupus Anticoagulant: 37.2 s (ref 0.0–43.5)

## 2024-11-09 LAB — DRVVT MIX: dRVVT Mix: 56.8 s — ABNORMAL HIGH (ref 0.0–40.4)

## 2024-11-09 LAB — DRVVT CONFIRM: dRVVT Confirm: 1.4 ratio — ABNORMAL HIGH (ref 0.8–1.2)

## 2024-11-11 LAB — BETA-2-GLYCOPROTEIN I ABS, IGG/M/A
Beta-2 Glyco I IgG: <9 GPI IgG units (ref 0–20)
Beta-2-Glycoprotein I IgA: <9 GPI IgA units (ref 0–25)
Beta-2-Glycoprotein I IgM: <9 GPI IgM units (ref 0–32)

## 2024-11-12 LAB — CARDIOLIPIN ANTIBODIES, IGG, IGM, IGA
Anticardiolipin IgA: <9 U/mL (ref 0–11)
Anticardiolipin IgG: <9 GPL U/mL (ref 0–14)
Anticardiolipin IgM: <9 [MPL'U]/mL (ref 0–12)

## 2024-11-25 ENCOUNTER — Inpatient Hospital Stay: Payer: Self-pay | Attending: Hematology and Oncology | Admitting: Hematology and Oncology

## 2024-11-25 DIAGNOSIS — I82413 Acute embolism and thrombosis of femoral vein, bilateral: Secondary | ICD-10-CM

## 2024-11-25 MED ORDER — RIVAROXABAN 20 MG PO TABS: 20.0000 mg | ORAL_TABLET | Freq: Every day | ORAL | 3 refills | Status: DC
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
## 2024-12-06 ENCOUNTER — Other Ambulatory Visit (HOSPITAL_COMMUNITY): Payer: Self-pay

## 2024-12-06 ENCOUNTER — Other Ambulatory Visit: Payer: Self-pay | Admitting: *Deleted

## 2024-12-06 MED ORDER — RIVAROXABAN 20 MG PO TABS: 20.0000 mg | ORAL_TABLET | Freq: Every day | ORAL | 3 refills | Status: AC

## 2024-12-06 NOTE — Telephone Encounter (Signed)
 Received call from pt stating cost for 90 xarelto  is $600 vs $207 for 30 day.  Pt requesting 30 day prescription sent to pharmacy on file.  RN sent message to prescription PA team to see if they can submit PA with insurance for coverage since pt will be on this lifelong.

## 2024-12-07 ENCOUNTER — Telehealth: Payer: Self-pay

## 2024-12-07 NOTE — Telephone Encounter (Signed)
 Pt called needing to speak w/ Cassie regarding drug assistance. I transferred her over to [facility] pharmacy tech.

## 2024-12-08 ENCOUNTER — Telehealth: Payer: Self-pay

## 2024-12-08 NOTE — Telephone Encounter (Signed)
 Received refill request from pt to refill Xarelto  at [facility] Refill was placed on 1/19 at that location. Attempted call to pt x1, left VM stating to call [facility] and call us  back if there are any issues.

## 2025-11-28 ENCOUNTER — Inpatient Hospital Stay: Payer: Self-pay | Admitting: Hematology and Oncology
